# Patient Record
Sex: Male | Born: 1985 | Race: White | Hispanic: No
Health system: Midwestern US, Community
[De-identification: ages and names within clinical notes are randomized; demographics above are authoritative.]

## PROBLEM LIST (undated history)

## (undated) DIAGNOSIS — I82409 Acute embolism and thrombosis of unspecified deep veins of unspecified lower extremity: Secondary | ICD-10-CM

## (undated) HISTORY — PX: WISDOM TOOTH EXTRACTION: SHX21

---

## 2013-05-14 NOTE — ED Provider Notes (Signed)
[facility]  EMERGENCY DEPARTMENT TREATMENT REPORT  NAME:  Carlos Bray  SEX:   M  ADMIT: 05/14/2013  DOB:   1986-03-07  MR#    [id number]  ROOM:    TIME DICTATED: 04 56 PM  ACCT#  [id number]        TIME OF EVALUATION:   1630    PRIMARY CARE DOCTOR:   None.    CHIEF COMPLAINT:   Chest pressure.    HISTORY OF PRESENT ILLNESS:   This is a 28 year old male who presents for evaluation of pressure in his   chest he began experiencing at approximately 1:30 this afternoon when he was   headed [place] the mall.  He told his wife he did not feel well and she should   drive him to the Emergency Department, so that is what she did.  He tells me   he had another episode of this type of feeling back in December when he was   diagnosed with the flu. This was back in December.  He has not had a cardiac   workup of any sort, has not had a stress test.  He is currently stating that   he is not experiencing the chest pressure. He did not experience any shortness   of breath with this or any diaphoresis. He did not take any medication prior   to his arrival in the Emergency Department.  His wife drove him over.  He   otherwise denies any fever, cough, is not experiencing any chest pain.  No   shortness of breath.  No abdominal pain. No nausea, no vomiting, no diarrhea.        REVIEW OF SYSTEMS:   CONSTITUTIONAL:  No fever, chills, weight loss.    EYES: No visual symptoms.   ENT: No sore throat, runny nose or other URI symptoms.   ENDOCRINE:  No diabetic symptoms.    HEMATOLOGIC AND LYMPHATIC:  No excessive bruising or lymph node swelling.    ALLERGIC AND IMMUNOLOGIC:  No urticaria or allergy symptoms.    RESPIRATORY:  No cough, shortness of breath, or wheezing.    CARDIOVASCULAR: Positive for chest pressure.  GASTROINTESTINAL:  No vomiting, diarrhea, or abdominal pain.    GENITOURINARY:  No dysuria, frequency, or urgency.   MUSCULOSKELETAL:  No joint pain or swelling.   INTEGUMENTARY:  No rashes.    NEUROLOGICAL:  No headaches, sensory or motor symptoms.     PAST MEDICAL HISTORY:   The patient has no pertinent past medical or surgical history.    FAMILY HISTORY:   No cardiac history in the family.    PSYCHIATRIC HISTORY:  No psychiatric history.     SOCIAL HISTORY:   The patient does not smoke, drinks socially, does not use illicit drugs.     ALLERGIES:   SULFA DRUGS.    MEDICATIONS:    None.       PHYSICAL EXAMINATION:   VITAL SIGNS: Blood pressure 146/78, pulse 109, respirations 16, temperature   97.7, he is satting at 100% on room air.  CONSTITUTIONAL: This is a well developed, well nourished appearing 28 year old   male gentleman lying comfortably on the bed, does not appear to be in any   acute distress.  He is cooperative with my evaluation.    HEENT: Eyes:  Conjunctivae clear, lids normal.  Pupils equal, symmetrical, and   normally reactive. Mouth/Throat:  Surfaces of the pharynx, palate, and tongue   are pink, moist, and without lesions.  RESPIRATORY:  Clear and equal breath sounds.  No respiratory distress,   tachypnea, or accessory muscle use.   CARDIOVASCULAR: Regular rate and rhythm. There are no murmurs, no rubs, no   gallops. As I stand in the room watching the monitor, he goes from   approximately 116 up to 130 beats per minute. I can visibly see his pulses in   his carotids and on the top of his chest.  DP pulses 2+ and equal bilaterally.   No peripheral edema or significant varicosities.   CHEST:  Chest symmetrical without masses or tenderness.     GASTROINTESTINAL:  Abdomen soft, nontender, without complaint of pain to   palpation.  No hepatomegaly or splenomegaly.   MUSCULOSKELETAL:   Nails:  No clubbing or deformities.  Nail beds pink with   prompt capillary refill.   SKIN:  Warm and dry without rashes.   NEUROLOGIC: The patient is alert and oriented times 3. He is able to move all   his extremities, upper and lower bilaterally, without difficulty.       CONTINUATION BY JEFFREY PADGETT, PA-C:     IMPRESSION AND PLAN:   This is a 28 year old male who presents for evaluation of chest pressure,   which has now resolved in the Emergency Department.  He has suffered from this   one other previous time. He is also having some tachycardia. He states   everytime he gets around a hospital, nurses, doctors - he gets tachycardic.    He also does admit to Dr. Jerolyn Center that he has had a history of acid reflux in   the past and he is suppose to be taking ranitidine for that, but he has not   been.  However, given his current presentation, we will go ahead and obtain a   cardiac set of enzymes on him and a chest x-ray and EKG.      DIAGNOSTIC INTERPRETATIONS:   CPK normal. Troponin normal.  BMP unremarkable with exception of chloride of   108, glucose 133. CBC unremarkable with exception of RBCs 5.81, hematocrit 51.   Chest x-ray is read by Dr. Verdis Frederickson as no acute disease.   EKG:  Dr. Stephan Minister did not see any acute S-T segment or T-wave abnormalities that are   consistent with acute ischemia or infarction.     COURSE IN THE EMERGENCY DEPARTMENT:     I discussed the results of the negative workup with the patient and the fact   that he is suppose to be on some medication for his acid reflux and this could   possibly be contributing to the problem. The patient stated he would start   that medication again and would follow up with primary care doctor tomorrow.   He felt he was ready for discharge at this point and we agreed.      DIAGNOSES:  1.  Chest pressure.  2.  Reflux esophagitis.     DISPOSITION:   The patient remained completely stable during his course in the Emergency   Department.  He was discharged home in stable condition.  I wrote him a   prescriptions for ranitidine. Encouraged fluids. Follow up with regular care   doctor, I gave him the name of primary care doctor as well, tomorrow. The   patient was agreeable with that plan.  The patient was seen and evaluated  by   myself and Dr. Jerolyn Center, who agrees with my assessment and plan.  ___________________  Konrad FelixLewis H Khoa Opdahl MD  Dictated By: Fransisca KaufmannJeffrey Padgett, PA-C    My signature above authenticates this document and my orders, the final  diagnosis (es), discharge prescription (s), and instructions in the [facility]   Pulsecheck record.  Nursing notes have been reviewed by the physician/mid-level provider.    If you have any questions please contact [phone].    JM  D:05/14/2013 16:56:02  T: 05/14/2013 09:81:1920:08:57  [id number]  Authenticated by Janett BillowLewis H. Henrene HawkingSiegel, M.D. On 05/19/2013 11:34:50 PM

## 2020-01-12 ENCOUNTER — Emergency Department (HOSPITAL_COMMUNITY): Payer: Worker's Compensation

## 2020-01-12 ENCOUNTER — Encounter (HOSPITAL_COMMUNITY): Payer: Self-pay

## 2020-01-12 ENCOUNTER — Emergency Department (HOSPITAL_COMMUNITY)
Admission: EM | Admit: 2020-01-12 | Discharge: 2020-01-12 | Disposition: A | Discharge status: Home / Self Care | Payer: Worker's Compensation | Attending: Emergency Medicine | Admitting: Emergency Medicine

## 2020-01-12 ENCOUNTER — Other Ambulatory Visit: Payer: Self-pay

## 2020-01-12 ENCOUNTER — Emergency Department (HOSPITAL_COMMUNITY): Payer: Self-pay

## 2020-01-12 DIAGNOSIS — I82411 Acute embolism and thrombosis of right femoral vein: Secondary | ICD-10-CM | POA: Insufficient documentation

## 2020-01-12 DIAGNOSIS — Z87891 Personal history of nicotine dependence: Secondary | ICD-10-CM | POA: Insufficient documentation

## 2020-01-12 DIAGNOSIS — W540XXD Bitten by dog, subsequent encounter: Secondary | ICD-10-CM | POA: Diagnosis not present

## 2020-01-12 DIAGNOSIS — Z882 Allergy status to sulfonamides status: Secondary | ICD-10-CM | POA: Diagnosis not present

## 2020-01-12 DIAGNOSIS — Z4800 Encounter for change or removal of nonsurgical wound dressing: Secondary | ICD-10-CM | POA: Diagnosis present

## 2020-01-12 LAB — CBC WITH DIFFERENTIAL/PLATELET
Abs Immature Granulocytes: 0.07 10*3/uL (ref 0.00–0.07)
Basophils Absolute: 0 10*3/uL (ref 0.0–0.1)
Basophils Relative: 0 %
Eosinophils Absolute: 0.2 10*3/uL (ref 0.0–0.5)
Eosinophils Relative: 2 %
HCT: 45.6 % (ref 39.0–52.0)
Hemoglobin: 14.9 g/dL (ref 13.0–17.0)
Immature Granulocytes: 1 %
Lymphocytes Relative: 8 %
Lymphs Abs: 0.8 10*3/uL (ref 0.7–4.0)
MCH: 28.9 pg (ref 26.0–34.0)
MCHC: 32.7 g/dL (ref 30.0–36.0)
MCV: 88.4 fL (ref 80.0–100.0)
Monocytes Absolute: 0.8 10*3/uL (ref 0.1–1.0)
Monocytes Relative: 8 %
Neutro Abs: 8.5 10*3/uL — ABNORMAL HIGH (ref 1.7–7.7)
Neutrophils Relative %: 81 %
Platelets: 389 10*3/uL (ref 150–400)
RBC: 5.16 MIL/uL (ref 4.22–5.81)
RDW: 11.8 % (ref 11.5–15.5)
WBC: 10.4 10*3/uL (ref 4.0–10.5)
nRBC: 0 % (ref 0.0–0.2)

## 2020-01-12 LAB — BASIC METABOLIC PANEL
Anion gap: 10 (ref 5–15)
BUN: 14 mg/dL (ref 6–20)
CO2: 25 mmol/L (ref 22–32)
Calcium: 8.9 mg/dL (ref 8.9–10.3)
Chloride: 101 mmol/L (ref 98–111)
Creatinine, Ser: 1 mg/dL (ref 0.61–1.24)
GFR, Estimated: >60 mL/min (ref >60)
Glucose, Bld: 134 mg/dL — ABNORMAL HIGH (ref 70–99)
Potassium: 3.5 mmol/L (ref 3.5–5.1)
Sodium: 136 mmol/L (ref 135–145)

## 2020-01-12 MED ORDER — APIXABAN (ELIQUIS) EDUCATION KIT FOR DVT/PE PATIENTS: PACK | Freq: Once | Status: AC

## 2020-01-12 MED ORDER — APIXABAN 5 MG PO TABS
10.0000 mg | ORAL_TABLET | Freq: Once | ORAL | Status: AC
  Administered 2020-01-12: 10 mg via ORAL
  Filled 2020-01-12: qty 2

## 2020-01-12 MED ORDER — IOHEXOL 300 MG/ML  SOLN
75.0000 mL | Freq: Once | INTRAMUSCULAR | Status: AC | PRN
  Administered 2020-01-12: 75 mL via INTRAVENOUS

## 2020-01-12 MED ORDER — APIXABAN (ELIQUIS) VTE STARTER PACK (10MG AND 5MG): ORAL_TABLET | ORAL | 0 refills | Status: DC

## 2020-01-12 NOTE — ED Notes (Signed)
Pt transported to CT ?

## 2020-01-12 NOTE — ED Triage Notes (Signed)
Pt presents to ED following dog bite while at work on 12/30/19. Animal control has already been notified. Dog was UTD on rabies vaccines. Pt has been seeing [facility] and wants second opinion to wound site on right leg. Pt states he has had an increased in swelling around the area since yesterday. Pt states temp has been about 100. Pt has been taking Augmentin. Pt UTD on tetanus.

## 2020-01-12 NOTE — ED Notes (Signed)
Ultrasound at bedside

## 2020-01-12 NOTE — ED Provider Notes (Signed)
[facility] EMERGENCY DEPARTMENT Provider Note   CSN: [id number] Arrival date & time: 01/12/20  0175     History Chief Complaint  Patient presents with  . Animal Bite    Briscoe Daniello is a 34 y.o. male.  Bertel Venard is a 34 y.o. male who is otherwise healthy, presents for wound check.  Patient states that while at work on 10/16 he was bit by a dog on the back of his right leg just below the knee and had 2 wounds he went the next morning to urgent care and they cleaned them out and initially sutured them close but a few days later he came back with worsening pain and swelling and some drainage, on the more lateral of the 2 wounds he developed an abscess, they removed the sutures and began packing the wound.  He has been following up daily for wound checks and has been on Augmentin for antibiotic coverage.  States he was last seen yesterday for packing change.  He states that he decided to come to the emergency department today because he was concerned that he was having increasing swelling in the lower leg and worsening pain.  He states that they took the sutures out of all the wounds but had only been packing one of them, he has a more medial wound that he is concerned could be developing an abscess now.  He denies fevers.  Has been taking his antibiotics regularly.  No history of blood clots.  Denies any associated chest pain or shortness of breath.  Has been taking ibuprofen for pain with some improvement.        History reviewed. No pertinent past medical history.  There are no problems to display for this patient.   Past Surgical History:  Procedure Laterality Date  . WISDOM TOOTH EXTRACTION         No family history on file.  Social History   Tobacco Use  . Smoking status: Former Research scientist (life sciences)  . Smokeless tobacco: Never Used  Substance Use Topics  . Alcohol use: Not Currently  . Drug use: Never    Home Medications Prior to Admission medications   Medication Sig Start Date  End Date Taking? Authorizing Provider  APIXABAN (ELIQUIS) VTE STARTER PACK ($RemoveBefor'10MG'mSlatgJZeHLc$  AND $Re'5MG'dIB$ ) Take as directed on package: start with two-$RemoveBefore'5mg'xdSFdowVjsoBk$  tablets twice daily for 7 days. On day 8, switch to one-$RemoveBefor'5mg'RjgZOXuuCCUG$  tablet twice daily. 01/12/20   Jacqlyn Larsen, PA-C    Allergies    Sulfa antibiotics  Review of Systems   Review of Systems  Constitutional: Negative for chills and fever.  HENT: Negative.   Respiratory: Negative for cough and shortness of breath.   Cardiovascular: Positive for leg swelling. Negative for chest pain and palpitations.  Gastrointestinal: Negative for abdominal pain, nausea and vomiting.  Musculoskeletal: Positive for myalgias.  Skin: Positive for color change and wound.  Neurological: Negative for weakness and numbness.  All other systems reviewed and are negative.   Physical Exam Updated Vital Signs BP (!) 160/93 (BP Location: Right Arm)   Pulse (!) 114   Temp 99.2 F (37.3 C) (Oral)   Ht $R'5\' 7"'lO$  (1.702 m)   Wt 113.4 kg   SpO2 98%   BMI 39.16 kg/m   Physical Exam Vitals and nursing note reviewed.  Constitutional:      General: He is not in acute distress.    Appearance: Normal appearance. He is well-developed. He is not diaphoretic.     Comments: Well-appearing and in  no distress  HENT:     Head: Normocephalic and atraumatic.  Eyes:     General:        Right eye: No discharge.        Left eye: No discharge.  Cardiovascular:     Rate and Rhythm: Normal rate and regular rhythm.     Heart sounds: Normal heart sounds.  Pulmonary:     Effort: Pulmonary effort is normal. No respiratory distress.     Breath sounds: Normal breath sounds.  Abdominal:     Palpations: Abdomen is soft.     Tenderness: There is no abdominal tenderness.  Musculoskeletal:     Comments: 2 wounds noted on the posterior right lower leg just below the knee.  The more lateral wound has packing material in place with no significant surrounding drainage or erythema, there is a small more  medial wound that is open, with palpation there is no expressible drainage, no packing material present.  Calf is firm and tender to palpation with swelling when compared to the left leg.  Edema is nonpitting.  Distal pulses 2+, normal sensation, strength and range of motion.  There is some mild redness around the more medial wound, no lymphangitic streaking.  Skin:    General: Skin is warm and dry.  Neurological:     Mental Status: He is alert and oriented to person, place, and time.     Coordination: Coordination normal.  Psychiatric:        Mood and Affect: Mood normal.        Behavior: Behavior normal.     ED Results / Procedures / Treatments   Labs (all labs ordered are listed, but only abnormal results are displayed) Labs Reviewed  BASIC METABOLIC PANEL - Abnormal; Notable for the following components:      Result Value   Glucose, Bld 134 (*)    All other components within normal limits  CBC WITH DIFFERENTIAL/PLATELET - Abnormal; Notable for the following components:   Neutro Abs 8.5 (*)    All other components within normal limits    EKG None  Radiology CT TIBIA FIBULA RIGHT W CONTRAST  Addendum Date: 01/12/2020   ADDENDUM REPORT: 01/12/2020 12:53 ADDENDUM: This case was subsequently reviewed and discussed with Benedetto Goad regarding the possibility of a DVT. No definite DVT is seen, although vascular opacification is limited on this examination. Venous Doppler ultrasound recommended if that remains a clinical concern. Electronically Signed   By: Richardean Sale M.D.   On: 01/12/2020 12:53   Result Date: 01/12/2020 CLINICAL DATA:  Dog bite to the lower leg 13 days ago. Increasing pain, swelling and drainage from the wound. EXAM: CT OF THE LOWER RIGHT EXTREMITY WITH CONTRAST TECHNIQUE: Multidetector CT imaging of the right lower leg was performed according to the standard protocol following intravenous contrast administration. COMPARISON:  None. CONTRAST:  47m OMNIPAQUE  IOHEXOL 300 MG/ML  SOLN FINDINGS: Bones/Joint/Cartilage The tibia and fibula appear normal. There is no evidence of acute fracture, dislocation or bone destruction. No significant knee or ankle joint effusion. The joint spaces appear preserved. Ligaments Suboptimally assessed by CT. Muscles and Tendons Superficial wound medially in the calf without underlying muscular abnormality or abnormal enhancement. Soft tissues There is a linear soft tissue defect within the subcutaneous fat the medial calf, extending up to 3.6 cm in length on image 64/5. There is high density within this wound, presumably related to packing or ointment. There is a small amount of gas in the  wound and soft tissue stranding in the surrounding fat consistent with cellulitis. No drainable fluid collection or definite foreign body. IMPRESSION: 1. Superficial wound medially in the calf with internal high density, presumably related to packing or ointment. Probable associated mild cellulitis. No drainable fluid collection or definite foreign body. 2. No evidence of deep soft tissue infection, osteomyelitis or septic joint. Electronically Signed: By: Richardean Sale M.D. On: 01/12/2020 12:16   US Venous Img Lower Unilateral Right  Result Date: 01/12/2020 CLINICAL DATA:  34 year old male with right calf swelling status post dog bite. EXAM: RIGHT LOWER EXTREMITY VENOUS DOPPLER ULTRASOUND TECHNIQUE: Gray-scale sonography with graded compression, as well as color Doppler and duplex ultrasound were performed to evaluate the right lower extremity deep venous systems from the level of the common femoral vein and including the common femoral, femoral, profunda femoral, popliteal and calf veins including the posterior tibial, peroneal and gastrocnemius veins when visible. Spectral Doppler was utilized to evaluate flow at rest and with distal augmentation maneuvers in the common femoral, femoral and popliteal veins. The contralateral common femoral vein  was also evaluated for comparison. COMPARISON:  None. FINDINGS: RIGHT LOWER EXTREMITY Common Femoral Vein: No evidence of thrombus. Normal compressibility, respiratory phasicity and response to augmentation. Central Greater Saphenous Vein: No evidence of thrombus. Normal compressibility and flow on color Doppler imaging. Central Profunda Femoral Vein: No evidence of thrombus. Normal compressibility and flow on color Doppler imaging. Femoral Vein: Occluded throughout with mildly expansile hypoechoic thrombus. Popliteal Vein: Occluded throughout with mildly expansile hypoechoic thrombus. Calf Veins: Occluded throughout. Other Findings:  None. LEFT LOWER EXTREMITY Common Femoral Vein: No evidence of thrombus. Normal compressibility, respiratory phasicity and response to augmentation. IMPRESSION: Acute right lower extremity deep vein thrombosis extending from the calf veins to the saphenofemoral junction. These results will be called to the ordering clinician or representative by the Radiologist Assistant, and communication documented in the PACS or [employer]. Ruthann Cancer, MD [facility] Electronically Signed   By: Ruthann Cancer MD   On: 01/12/2020 13:55    Procedures Procedures (including critical care time)  Medications Ordered in ED Medications  iohexol (OMNIPAQUE) 300 MG/ML solution 75 mL (75 mLs Intravenous Contrast Given 01/12/20 1149)  apixaban (ELIQUIS) Education Kit for DVT/PE patients ( Does not apply Given 01/12/20 1559)  apixaban (ELIQUIS) tablet 10 mg (10 mg Oral Given 01/12/20 1537)    ED Course  I have reviewed the triage vital signs and the nursing notes.  Pertinent labs & imaging results that were available during my care of the patient were reviewed by me and considered in my medical decision making (see chart for details).    MDM Rules/Calculators/A&P                          34 year old male presents with concern  for worsening infection following a dog bite while at work on 10/16.  The day after he was evaluated at [facility] urgent care, and had stitches placed, a few days later he returned due to drainage from one of the wounds, they remove stitches, drained developed abscess and have been packing the wound and seeing him back frequently for wound checks.  He got concerned that he was seeing worsening swelling in his calf.  Concerned that patient could be developing a larger fluid collection will get basic labs and CT of the lower leg with contrast.  Lab work is reassuring without leukocytosis or  significant electrolyte derangements  CT with superficial wound medially in the calf with packing material noted mild associated cellulitis but no drainable fluid collection no evidence of deep soft tissue infection.  No evidence of osteomyelitis or septic joint.  Given reassuring CT that does not explain worsening pain and swelling will get DVT study.  Called by pharmacy, patient is positive for DVT which certainly explains his worsening pain and swelling.  We will have him continue with Augmentin and wound checks as directed by urgent care and will start him on Eliquis.  Pharmacy consulted for education.  Pharmacy provided education on Eliquis and provided patient with starter pack and discount card.  He has been given his first dose of Eliquis here in the ED.  He expresses understanding and agreement with plan.  I have stressed the importance of close follow-up with a primary care provider to help monitor his progress and help determine how long he will need to be on blood thinner.  We will have him continue course of antibiotics.  And follow-up with the urgent care for continued wound care as planned.  Discharged home in good condition.  Final Clinical Impression(s) / ED Diagnoses Final diagnoses:  Acute deep vein thrombosis (DVT) of femoral vein of right lower extremity ([place])  Dog bite, subsequent encounter    Rx /  DC Orders ED Discharge Orders         Ordered    APIXABAN (ELIQUIS) VTE STARTER PACK (10MG AND 5MG)        01/12/20 1604           Jacqlyn Larsen, PA-C 01/16/20 0243    Sherwood Gambler, MD 01/17/20 [phone]

## 2020-01-12 NOTE — Discharge Instructions (Addendum)
You have a blood clot, this is why your leg is become more swollen, CT did not show signs of worsening infection, continue with antibiotics and wound care as directed by urgent care.  You will need to begin taking a blood thinner twice daily as directed.  Follow instructions on starter pack for appropriate dosages.  It is extremely important that you follow-up closely with a primary care provider to help continue prescribing this medicine and to help determine how long you will need to be on it.  If you develop worsening leg pain or swelling, or any chest pain or shortness of breath return for reevaluation.  [facility] Primary Care Doctor List  Syliva Overman, MD. Specialty: [facility] Medicine Contact information: [address]  [place] [address]  [phone]   Lilyan Punt, MD. Specialty: [facility] Medicine Contact information: [address]  [place] [address]  [phone]   Avon Gully, MD Specialty: Internal Medicine Contact information: [address] [place] [address]  [phone]   Catalina Pizza, MD. Specialty: Internal Medicine Contact information: [address]  [place] [address]  [phone]    [facility] (Dr. Selena Batten) Specialty: Family Medicine Contact information: [address]  [place] [address]  [phone]   John Giovanni, MD. Specialty: [facility] Medicine Contact information: [address]  [address]  [place] [address]  [phone]   Carylon Perches, MD. Specialty: Internal Medicine Contact information: [address]  [address]  [place] [address]  [phone]    [facility]  [address] [place], [place] [address] [phone]  Services [facility] offers a variety of basic health services.  Services include but are not limited to: Blood pressure checks  Heart rate checks  Blood sugar checks  Urine  analysis  Rapid strep tests  Pregnancy tests.  Health education and referrals  People needing more complex services will be directed to a physician online. Using these virtual visits, doctors can evaluate and prescribe medicine and treatments. There will be no medication on-site, though [facility] will help patients fill their prescriptions at little to no cost.   For More information please go to: [email or web address]

## 2020-02-21 ENCOUNTER — Emergency Department (HOSPITAL_COMMUNITY): Payer: Worker's Compensation

## 2020-02-21 ENCOUNTER — Encounter (HOSPITAL_COMMUNITY): Payer: Self-pay | Admitting: Emergency Medicine

## 2020-02-21 ENCOUNTER — Other Ambulatory Visit: Payer: Self-pay

## 2020-02-21 ENCOUNTER — Emergency Department (HOSPITAL_COMMUNITY)
Admission: EM | Admit: 2020-02-21 | Discharge: 2020-02-21 | Disposition: A | Discharge status: Home / Self Care | Payer: Worker's Compensation | Attending: Emergency Medicine | Admitting: Emergency Medicine

## 2020-02-21 DIAGNOSIS — M79604 Pain in right leg: Secondary | ICD-10-CM | POA: Diagnosis not present

## 2020-02-21 DIAGNOSIS — Z7901 Long term (current) use of anticoagulants: Secondary | ICD-10-CM | POA: Insufficient documentation

## 2020-02-21 DIAGNOSIS — Z86718 Personal history of other venous thrombosis and embolism: Secondary | ICD-10-CM | POA: Diagnosis not present

## 2020-02-21 DIAGNOSIS — Z87891 Personal history of nicotine dependence: Secondary | ICD-10-CM | POA: Insufficient documentation

## 2020-02-21 HISTORY — DX: Acute embolism and thrombosis of unspecified deep veins of unspecified lower extremity: I82.409

## 2020-02-21 NOTE — Discharge Instructions (Addendum)
The ultrasound of your leg today does not show any new blood clots of your right leg.  Continue to take your Eliquis daily as directed.  You may take Tylenol every 4 hours if needed for pain.  Follow-up with your primary doctor for recheck

## 2020-02-21 NOTE — ED Triage Notes (Addendum)
Pt c/o rt leg swelling with walking. Pt had a blood clot on 10/29 and was placed on eliquis. Pulses intact. Both feet cold to the touch.   Denies pain.

## 2020-02-21 NOTE — ED Provider Notes (Signed)
[facility] EMERGENCY DEPARTMENT Provider Note   CSN: [id number] Arrival date & time: 02/21/20  1534     History Chief Complaint  Patient presents with  . Leg Pain    James Rhodes is a 34 y.o. male.  HPI     James Rhodes is a 34 y.o. male who presents to the Emergency Department complaining of recurrent pain, redness and swelling of his right lower leg.  He suffered a dog bite to the back of his right lower leg in October.  He was initially treated for the dog bite at a urgent care center and subsequently developed an abscess to the area.  He was seen here the latter part of October found to have a DVT of the same leg.  He was started on Eliquis at that time.  He has been followed by the wound care center for wound packing 3 times per week states the wound has been healing well.  He noticed gradually increasing pain associated with standing and walking and associated swelling of the right lower leg several days ago.  He denies any missed doses of his Eliquis, but is concerned the he has a worsening clot of his leg.  He denies any chest pain, shortness of breath, fever or chills, numbness or weakness of his lower extremities.    Past Medical History:  Diagnosis Date  . DVT (deep venous thrombosis) (HCC)     There are no problems to display for this patient.   Past Surgical History:  Procedure Laterality Date  . WISDOM TOOTH EXTRACTION       History reviewed. No pertinent family history.  Social History   Tobacco Use  . Smoking status: Former Games developer  . Smokeless tobacco: Never Used  Vaping Use  . Vaping Use: Never used  Substance Use Topics  . Alcohol use: Not Currently  . Drug use: Never    Home Medications Prior to Admission medications   Medication Sig Start Date End Date Taking? Authorizing Provider  APIXABAN (ELIQUIS) VTE STARTER PACK (10MG  AND 5MG ) Take as directed on package: start with two-5mg  tablets twice daily for 7 days. On day 8, switch to one-5mg  tablet  twice daily. 01/12/20   , PA-C    Allergies    Sulfa antibiotics  Review of Systems   Review of Systems  Constitutional: Negative for chills and fever.  Respiratory: Negative for chest tightness and shortness of breath.   Cardiovascular: Negative for chest pain.  Gastrointestinal: Negative for abdominal pain, nausea and vomiting.  Genitourinary: Negative for difficulty urinating.  Musculoskeletal: Positive for myalgias (right lower leg pain, swelling and redness). Negative for arthralgias, back pain and joint swelling.  Skin: Negative for color change and wound.  Neurological: Negative for dizziness, weakness and numbness.  Psychiatric/Behavioral: Negative for confusion.    Physical Exam Updated Vital Signs BP (!) 143/91 (BP Location: Left Arm)   Pulse 90   Temp 98.4 F (36.9 C) (Oral)   Resp 18   Ht 5\' 7"  (1.702 m)   Wt 111.1 kg   SpO2 100%   BMI 38.37 kg/m   Physical Exam Vitals and nursing note reviewed.  Constitutional:      Appearance: Normal appearance. He is not ill-appearing.  Cardiovascular:     Rate and Rhythm: Normal rate and regular rhythm.     Pulses: Normal pulses.  Pulmonary:     Effort: Pulmonary effort is normal.     Breath sounds: Normal breath sounds.  Abdominal:  Palpations: Abdomen is soft.     Tenderness: There is no abdominal tenderness.  Musculoskeletal:        General: Tenderness present. No signs of injury.     Comments: Mild erythema and edema of the right lower extremity.  Tenderness along the posterior right lower leg.  No palpable cord or lymphadenopathy.  Patient has a 1-2 cm open wound to the medial aspect of the right lower leg with packing in place.  No surrounding erythema or purulent drainage.  Wound appears chronic.  Skin:    General: Skin is warm.     Capillary Refill: Capillary refill takes less than 2 seconds.     Findings: No rash.  Neurological:     General: No focal deficit present.     Mental Status: He  is alert.     Sensory: No sensory deficit.     Motor: No weakness.     ED Results / Procedures / Treatments   Labs (all labs ordered are listed, but only abnormal results are displayed) Labs Reviewed - No data to display  EKG None  Radiology US Venous Img Lower Unilateral Right  Result Date: 02/21/2020 CLINICAL DATA:  34 year old with history of right lower extremity DVT. Right leg pain. EXAM: RIGHT LOWER EXTREMITY VENOUS DOPPLER ULTRASOUND TECHNIQUE: Gray-scale sonography with graded compression, as well as color Doppler and duplex ultrasound were performed to evaluate the lower extremity deep venous systems from the level of the common femoral vein and including the common femoral, femoral, profunda femoral, popliteal and calf veins including the posterior tibial, peroneal and gastrocnemius veins when visible. The superficial great saphenous vein was also interrogated. Spectral Doppler was utilized to evaluate flow at rest and with distal augmentation maneuvers in the common femoral, femoral and popliteal veins. COMPARISON:  01/12/2020 FINDINGS: Contralateral Common Femoral Vein: Negative for thrombus. Normal compressibility, color Doppler flow and augmentation. Common Femoral Vein: Negative for thrombus. Normal compressibility, color Doppler flow and augmentation. Saphenofemoral Junction: No evidence of thrombus. Normal compressibility and flow on color Doppler imaging. Thrombus near the saphenofemoral junction has resolved since the previous examination. Profunda Femoral Vein: No evidence of thrombus. Normal compressibility and flow on color Doppler imaging. Femoral Vein: Positive for thrombus. Majority of thrombus in the proximal femoral vein has resolved. Persistent thrombus in the mid and distal femoral vein. Partial recanalization in the mid and distal femoral vein since the previous examination. Popliteal Vein: Positive for thrombus. Persistent thrombus in the popliteal vein. Thrombus is  not completely occlusive. Calf Veins: Positive for thrombus. Nonocclusive thrombus involving right posterior tibial veins. Previously posterior tibial vein thrombus was occlusive. Limited evaluation of the peroneal veins but there is flow in the peroneal veins. Superficial Great Saphenous Vein: No evidence of thrombus. Normal compressibility. Other Findings:  None. IMPRESSION: Right lower extremity deep venous thrombosis clot burden has decreased. Thrombus at the right saphenofemoral junction and proximal right femoral vein has nearly resolved. Nonocclusive thrombus remaining in the right mid and distal femoral vein, right popliteal vein and right deep calf veins. Electronically Signed   By: Richarda Overlie M.D.   On: 02/21/2020 17:24    Procedures Procedures (including critical care time)  Medications Ordered in ED Medications - No data to display  ED Course  I have reviewed the triage vital signs and the nursing notes.  Pertinent labs & imaging results that were available during my care of the patient were reviewed by me and considered in my medical decision making (see chart for  details).    MDM Rules/Calculators/A&P                          Patient here with concerns of recurrent or worsening DVT of the right lower leg.  History of dog bite to the right lower leg in October.  He is anticoagulated with Eliquis twice daily no missed doses.  He has a healing open wound to the medial right lower leg, he is followed by wound care clinic and has dressing changes 3 times per week.  On exam, there is mild edema and erythema noted to the right lower leg. No lymphangitis.  DP and posterior pulses are intact.  Given patient is anticoagulated, I doubt new blood clot but he is stating the swelling and redness are new, so DVT study ordered.  Ultrasound of right lower extremity negative for worsening or new clots.  No concerning symptoms on exam to suggest cellulitis or abscess.  Patient reassured.  He agrees to  continue current treatment, Eliquis, and close outpatient follow-up.  The patient appears reasonably screened and/or stabilized for discharge and I doubt any other medical condition or other [facility] requiring further screening, evaluation, or treatment in the ED at this time prior to discharge.  Final Clinical Impression(s) / ED Diagnoses Final diagnoses:  Right leg pain    Rx / DC Orders ED Discharge Orders    None       Rosey Bath 02/21/20 1904    Dione Booze, MD 02/22/20 0010

## 2021-03-10 IMAGING — CT CT TIBIA FIBULA *R* W/ CM
3 of 4 series · 9 of 33 positions shown, 10 images · IV contrast (agent unspecified)
Comparison: None.

CONTRAST:  75mL OMNIPAQUE IOHEXOL 300 MG/ML  SOLN
COMPARISON: None.

CONTRAST:  75mL OMNIPAQUE IOHEXOL 300 MG/ML  SOLN

Addendum:
CLINICAL DATA: Dog bite to the lower leg 13 days ago. Increasing
pain, swelling and drainage from the wound.

EXAM:
CT OF THE LOWER RIGHT EXTREMITY WITH CONTRAST
TECHNIQUE: Multidetector CT imaging of the right lower leg was performed
according to the standard protocol following intravenous contrast
administration.

[Series 5: axial st · axial · 0.57mm/px · z∈[-448,-154]mm · 3 of 239 slices shown, 4 images]
[im 55/239  soft-tissue]
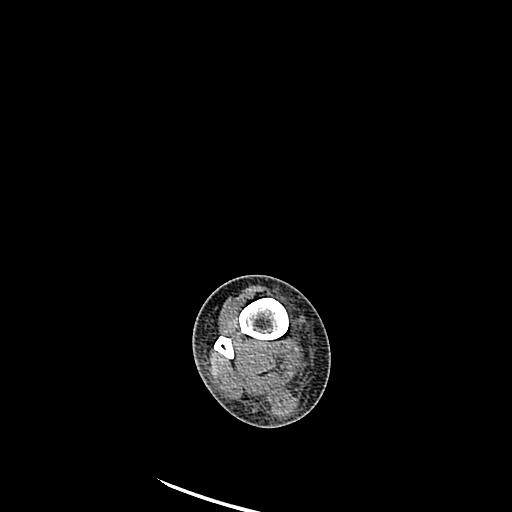
[im 55/239  bone]
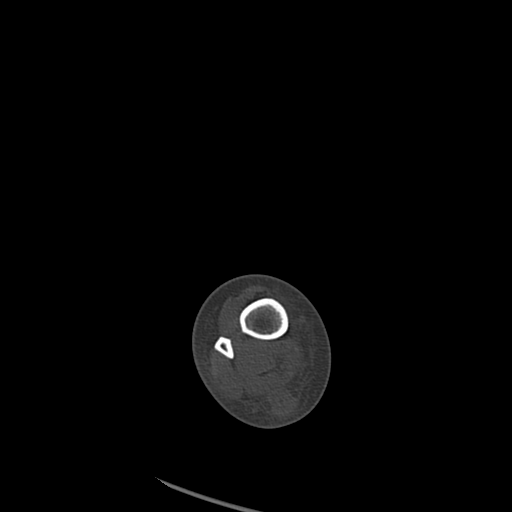
[im 129/239  bone]
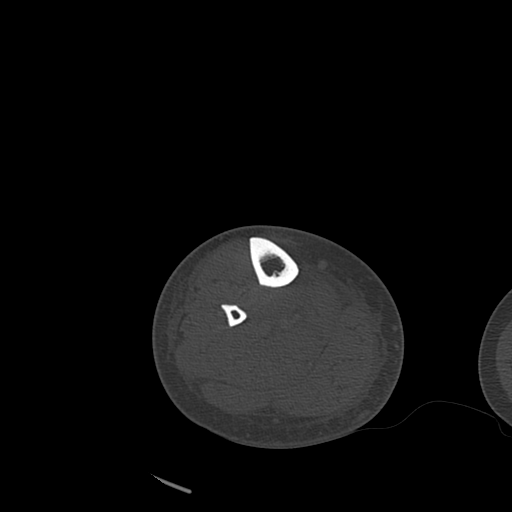
[im 202/239  bone]
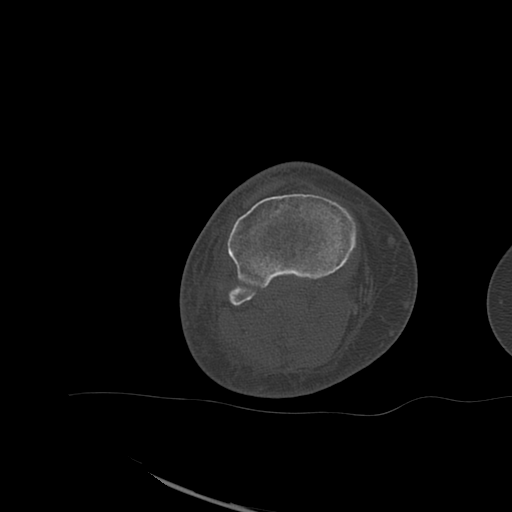

[Series 6: coro bone · coronal · 0.39mm/px · 1 of 95 slices shown]
[im 48/95  bone]
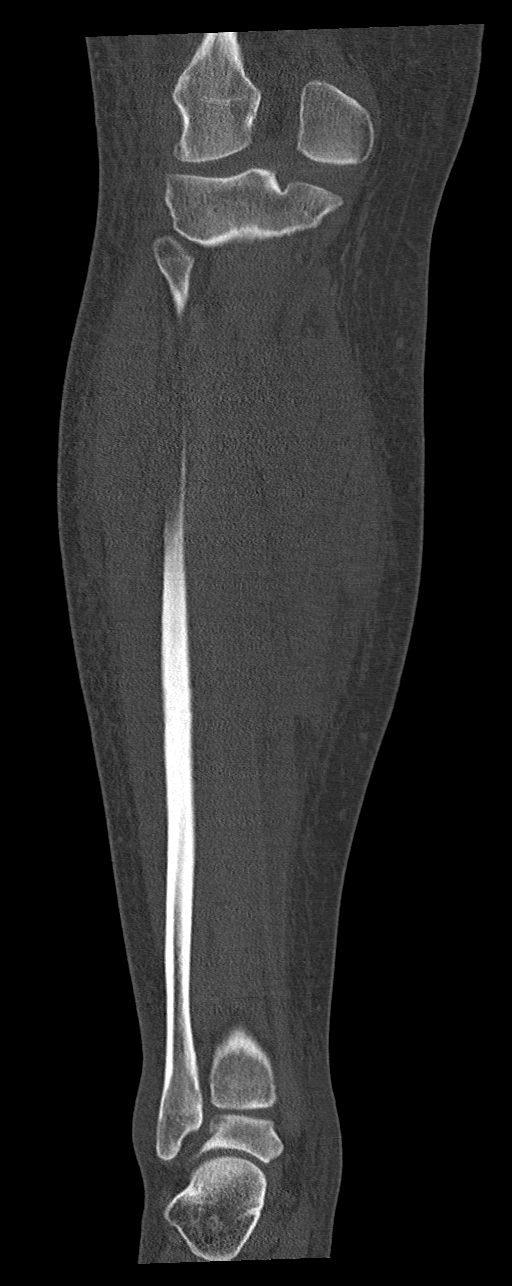

[Series 9: sag st · sagittal · 0.42mm/px · 5 of 99 slices shown]
[im 33/99  bone]
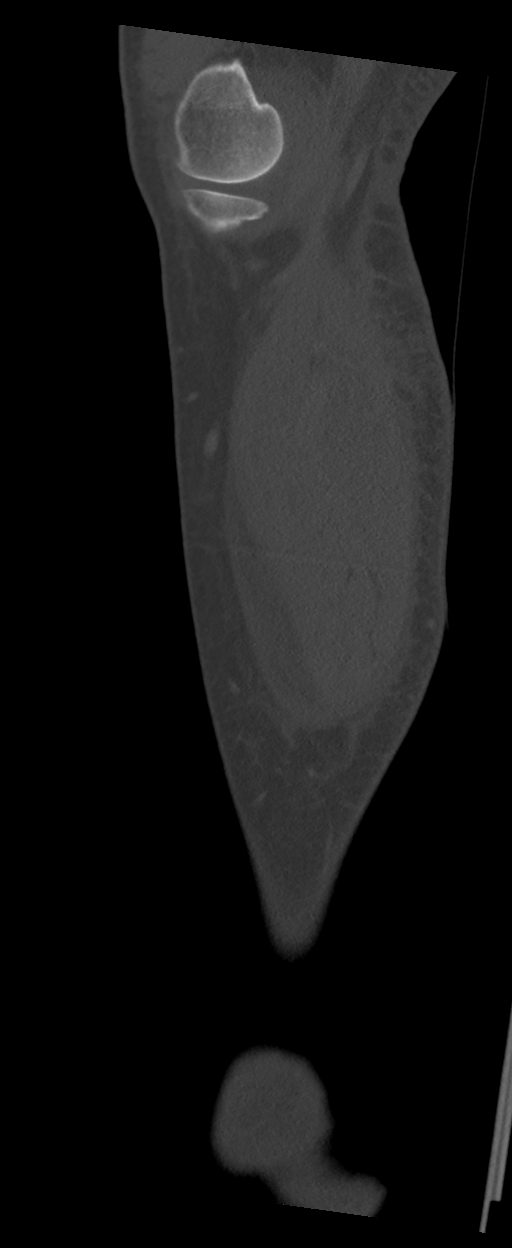
[im 41/99  bone]
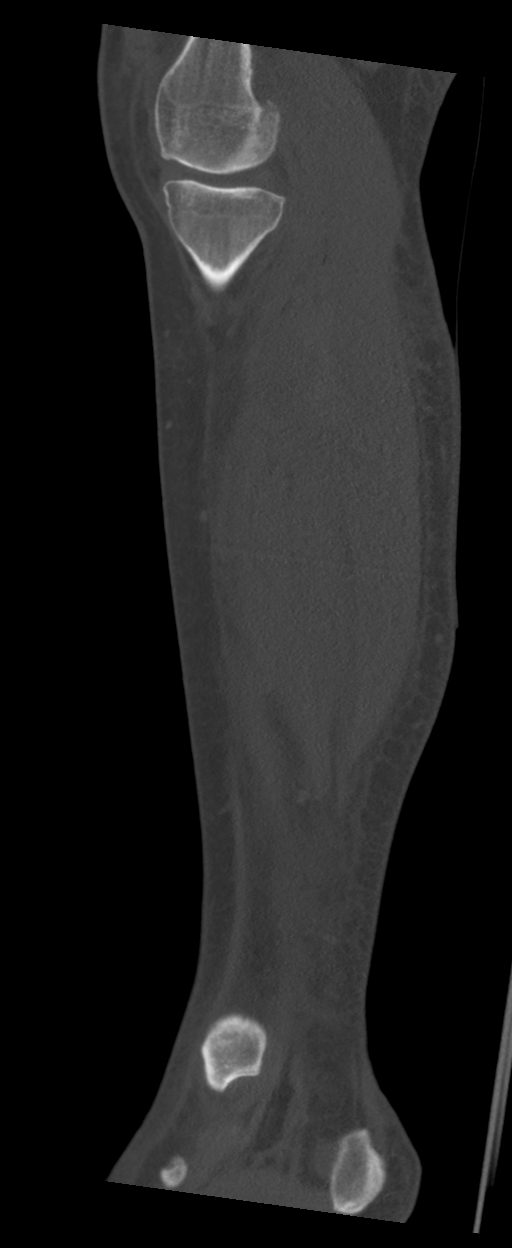
[im 50/99  bone]
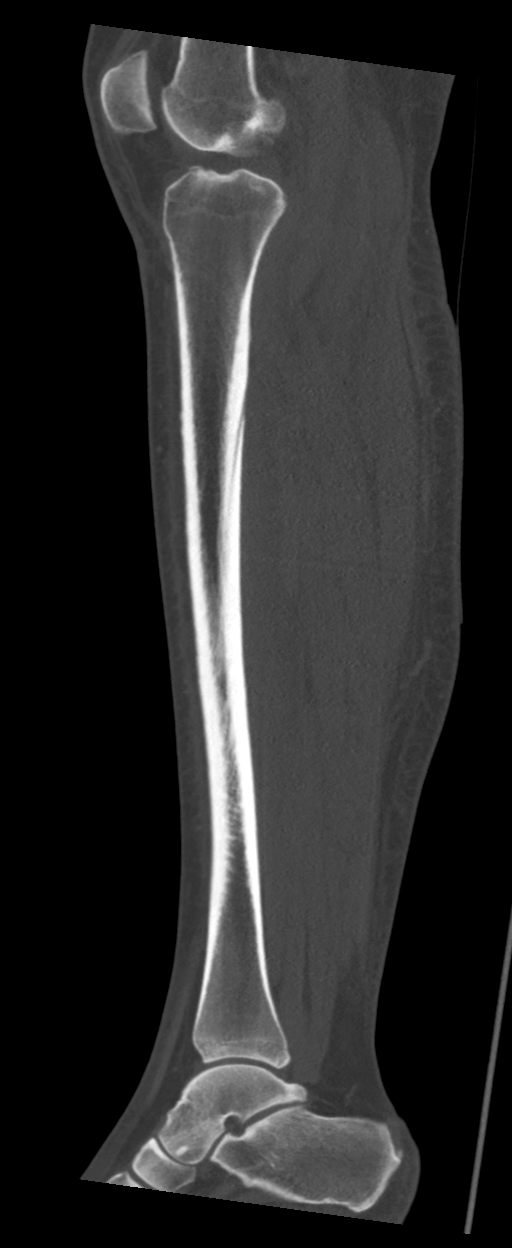
[im 58/99  bone]
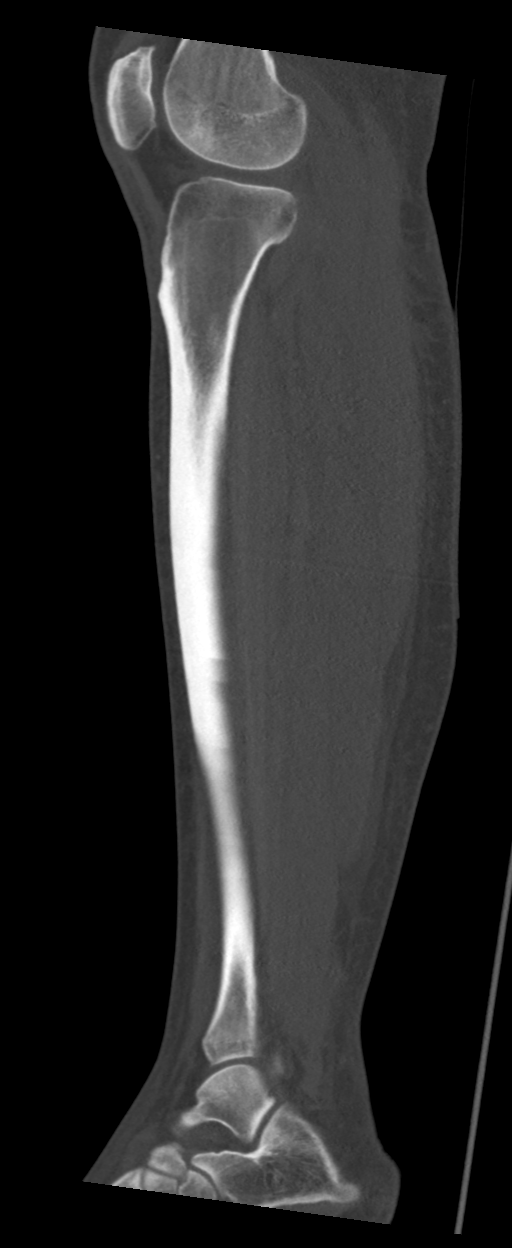
[im 66/99  bone]
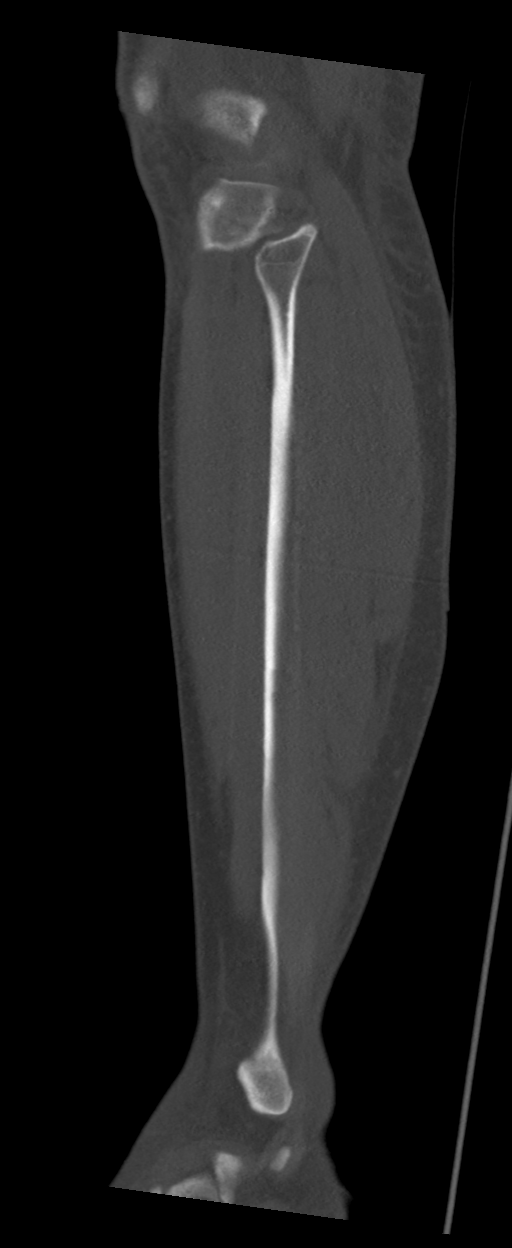

[9 of 33 positions shown; findings below may reference images not displayed]

FINDINGS: Bones/Joint/Cartilage

The tibia and fibula appear normal. There is no evidence of acute
fracture, dislocation or bone destruction. No significant knee or
ankle joint effusion. The joint spaces appear preserved.

Ligaments

Suboptimally assessed by CT.

Muscles and Tendons

Superficial wound medially in the calf without underlying muscular
abnormality or abnormal enhancement.

Soft tissues

There is a linear soft tissue defect within the subcutaneous fat the
medial calf, extending up to 3.6 cm in length on image 64/5. There
is high density within this wound, presumably related to packing or
ointment. There is a small amount of gas in the wound and soft
tissue stranding in the surrounding fat consistent with cellulitis.
No drainable fluid collection or definite foreign body.
IMPRESSION: 1. Superficial wound medially in the calf with internal high
density, presumably related to packing or ointment. Probable
associated mild cellulitis. No drainable fluid collection or
definite foreign body.
2. No evidence of deep soft tissue infection, osteomyelitis or
septic joint.

ADDENDUM:
This case was subsequently reviewed and discussed with [employer]
regarding the possibility of a DVT. No definite DVT is seen,
although vascular opacification is limited on this examination.
Venous Doppler ultrasound recommended if that remains a clinical
concern.

*** End of Addendum ***
FINDINGS: Bones/Joint/Cartilage

The tibia and fibula appear normal. There is no evidence of acute
fracture, dislocation or bone destruction. No significant knee or
ankle joint effusion. The joint spaces appear preserved.

Ligaments

Suboptimally assessed by CT.

Muscles and Tendons

Superficial wound medially in the calf without underlying muscular
abnormality or abnormal enhancement.

Soft tissues

There is a linear soft tissue defect within the subcutaneous fat the
medial calf, extending up to 3.6 cm in length on image 64/5. There
is high density within this wound, presumably related to packing or
ointment. There is a small amount of gas in the wound and soft
tissue stranding in the surrounding fat consistent with cellulitis.
No drainable fluid collection or definite foreign body.
IMPRESSION: 1. Superficial wound medially in the calf with internal high
density, presumably related to packing or ointment. Probable
associated mild cellulitis. No drainable fluid collection or
definite foreign body.
2. No evidence of deep soft tissue infection, osteomyelitis or
septic joint.

## 2021-04-19 IMAGING — US US EXTREM LOW VENOUS*R*
1 series · 13 of 24 positions shown · non-contrast
Comparison: 01/12/2020

CLINICAL DATA: 34-year-old with history of right lower extremity
DVT. Right leg pain.



[Series 1: us venous img lower uni right (dvt) · portal-venous · 13 of 43 slices shown]
[im 1/43]
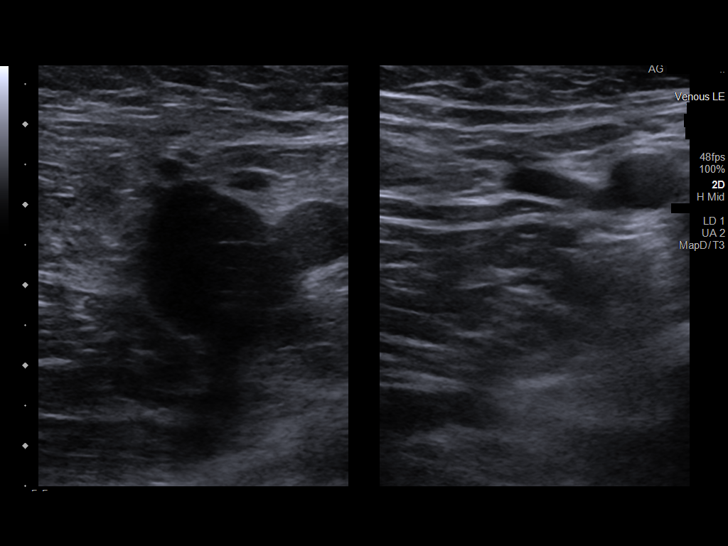
[im 4/43]
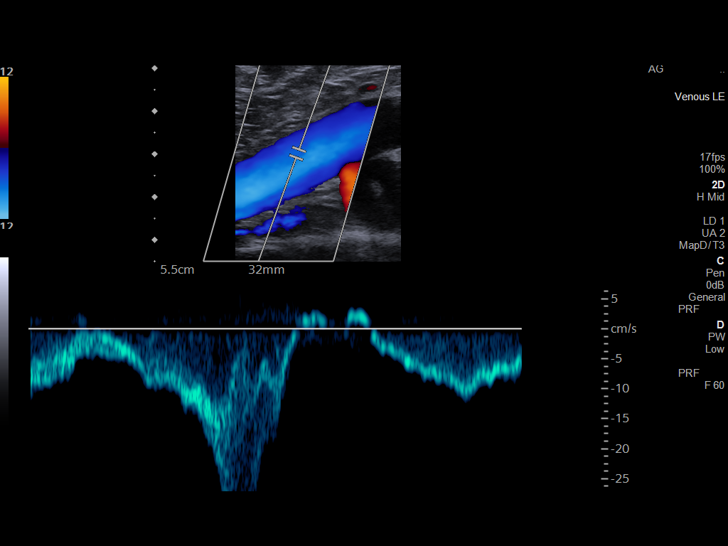
[im 8/43]
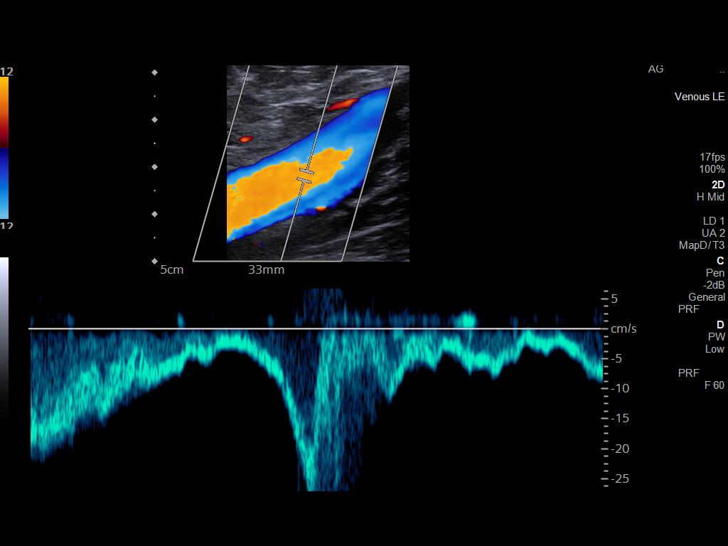
[im 11/43]
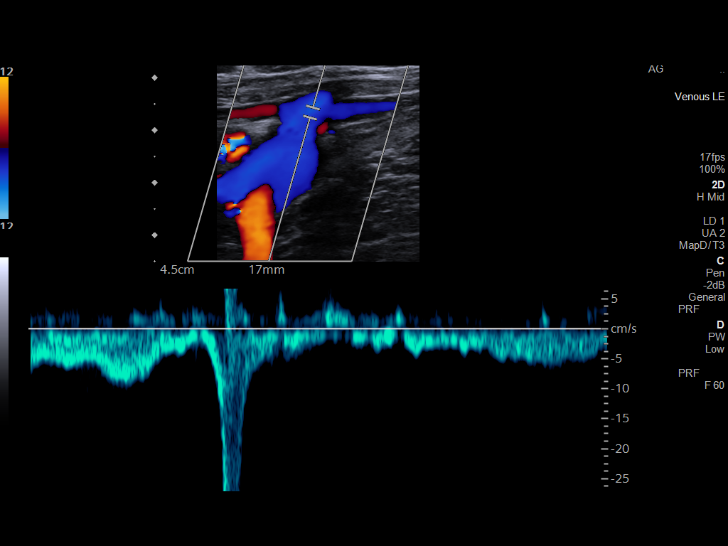
[im 15/43]
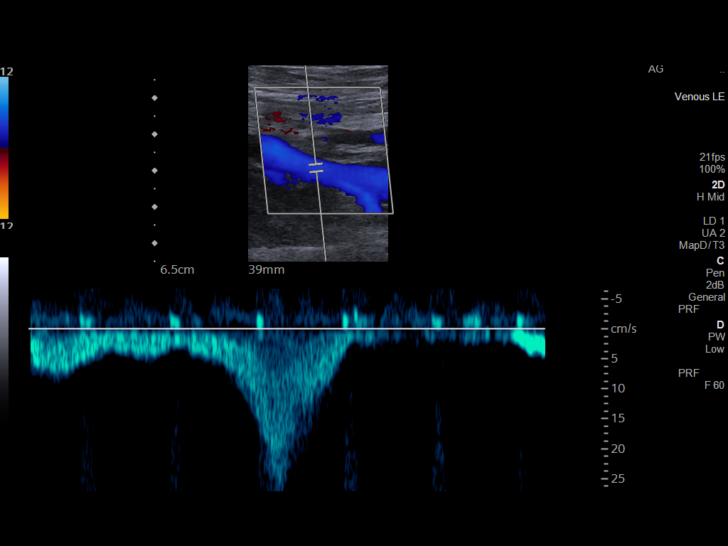
[im 19/43]
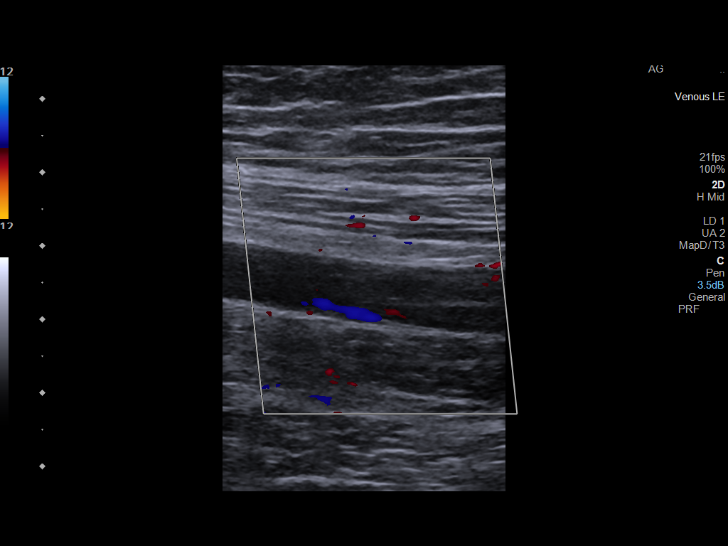
[im 22/43]
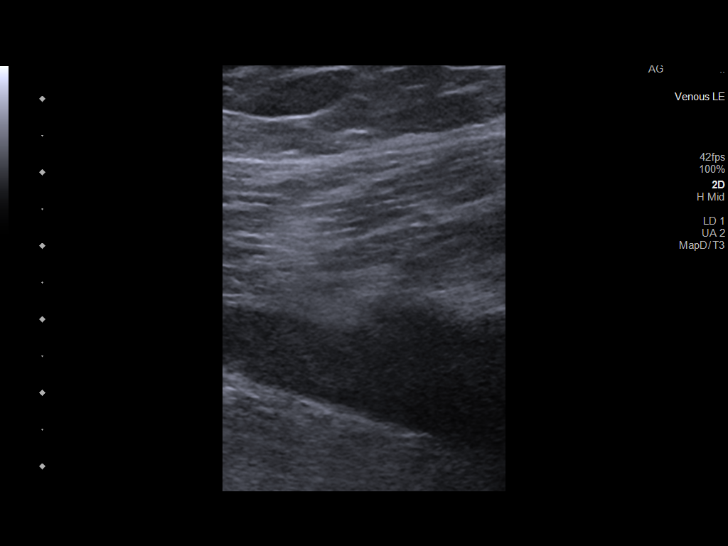
[im 24/43]
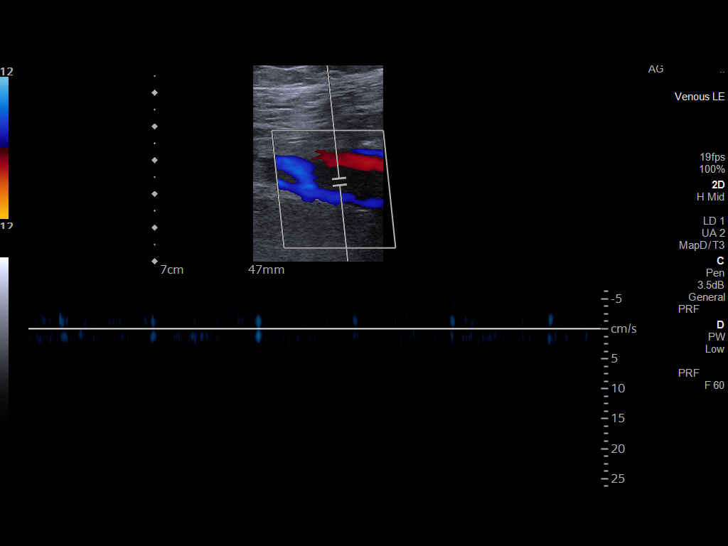
[im 28/43]
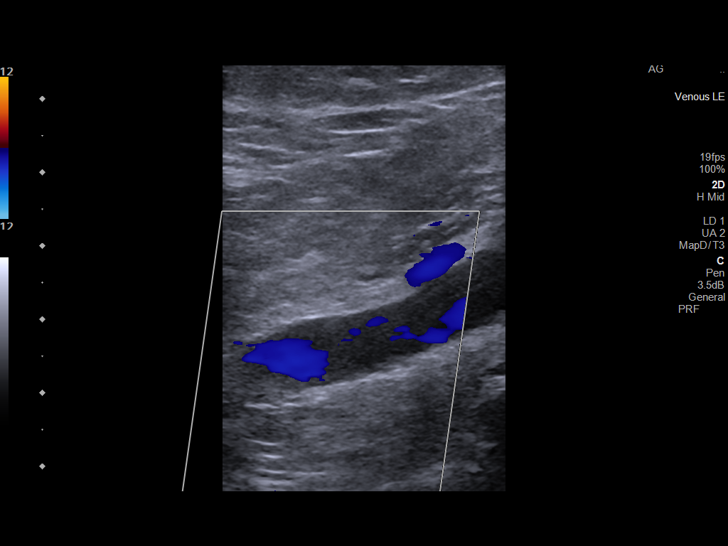
[im 32/43]
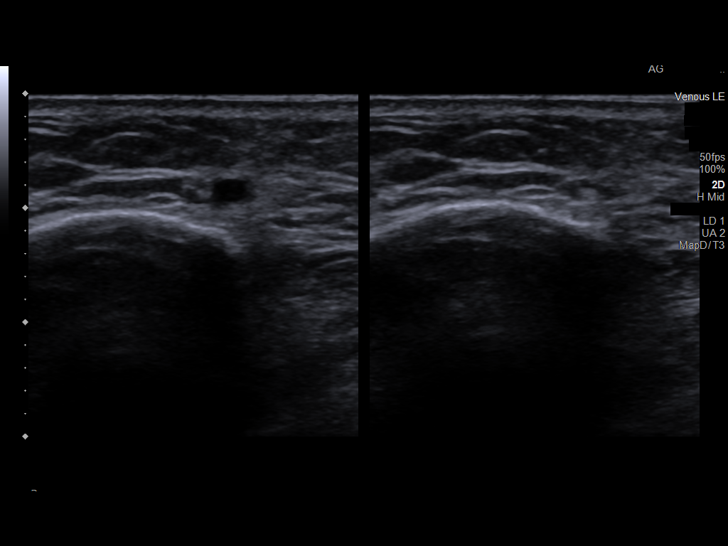
[im 35/43]
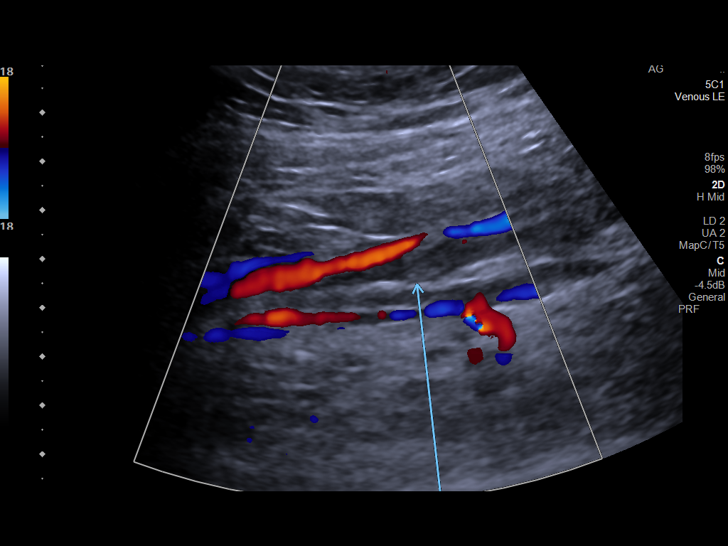
[im 39/43]
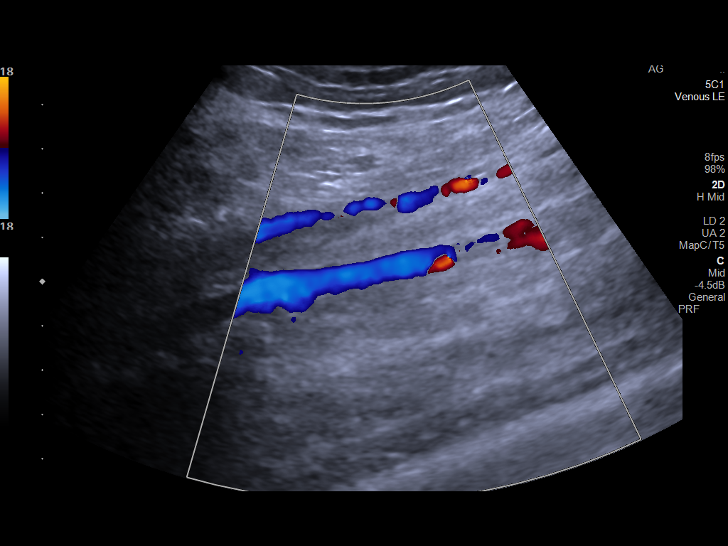
[im 43/43]
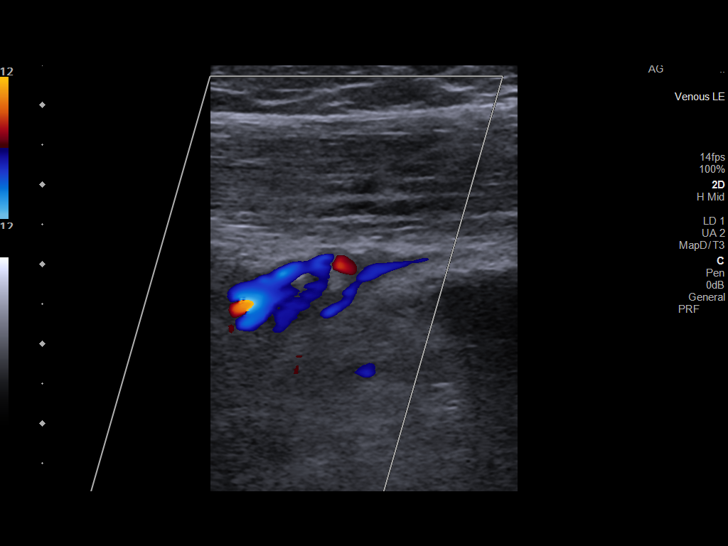

[13 of 24 positions shown; findings below may reference images not displayed]

FINDINGS: Contralateral Common Femoral Vein: Negative for thrombus. Normal
compressibility, color Doppler flow and augmentation.

Common Femoral Vein: Negative for thrombus. Normal compressibility,
color Doppler flow and augmentation.

Saphenofemoral Junction: No evidence of thrombus. Normal
compressibility and flow on color Doppler imaging. Thrombus near the
saphenofemoral junction has resolved since the previous examination.

Profunda Femoral Vein: No evidence of thrombus. Normal
compressibility and flow on color Doppler imaging.

Femoral Vein: Positive for thrombus. Majority of thrombus in the
proximal femoral vein has resolved. Persistent thrombus in the mid
and distal femoral vein. Partial recanalization in the mid and
distal femoral vein since the previous examination.

Popliteal Vein: Positive for thrombus. Persistent thrombus in the
popliteal vein. Thrombus is not completely occlusive.

Calf Veins: Positive for thrombus. Nonocclusive thrombus involving
right posterior tibial veins. Previously posterior tibial vein
thrombus was occlusive. Limited evaluation of the peroneal veins but
there is flow in the peroneal veins.

Superficial Great Saphenous Vein: No evidence of thrombus. Normal
compressibility.

Other Findings:  None.
IMPRESSION: Right lower extremity deep venous thrombosis clot burden has
decreased. Thrombus at the right saphenofemoral junction and
proximal right femoral vein has nearly resolved. Nonocclusive
thrombus remaining in the right mid and distal femoral vein, right
popliteal vein and right deep calf veins.

## 2022-02-05 ENCOUNTER — Emergency Department (HOSPITAL_COMMUNITY)
Admission: EM | Admit: 2022-02-05 | Discharge: 2022-02-05 | Disposition: A | Discharge status: Home / Self Care | Payer: Self-pay | Attending: Emergency Medicine | Admitting: Emergency Medicine

## 2022-02-05 ENCOUNTER — Other Ambulatory Visit: Payer: Self-pay

## 2022-02-05 ENCOUNTER — Encounter (HOSPITAL_COMMUNITY): Payer: Self-pay

## 2022-02-05 DIAGNOSIS — K649 Unspecified hemorrhoids: Secondary | ICD-10-CM

## 2022-02-05 DIAGNOSIS — R42 Dizziness and giddiness: Secondary | ICD-10-CM | POA: Insufficient documentation

## 2022-02-05 DIAGNOSIS — Z7901 Long term (current) use of anticoagulants: Secondary | ICD-10-CM | POA: Insufficient documentation

## 2022-02-05 DIAGNOSIS — K625 Hemorrhage of anus and rectum: Secondary | ICD-10-CM | POA: Insufficient documentation

## 2022-02-05 LAB — CBC WITH DIFFERENTIAL/PLATELET
Abs Immature Granulocytes: 0.02 10*3/uL (ref 0.00–0.07)
Basophils Absolute: 0.1 10*3/uL (ref 0.0–0.1)
Basophils Relative: 1 %
Eosinophils Absolute: 0 10*3/uL (ref 0.0–0.5)
Eosinophils Relative: 1 %
HCT: 50.6 % (ref 39.0–52.0)
Hemoglobin: 17.1 g/dL — ABNORMAL HIGH (ref 13.0–17.0)
Immature Granulocytes: 0 %
Lymphocytes Relative: 8 %
Lymphs Abs: 0.6 10*3/uL — ABNORMAL LOW (ref 0.7–4.0)
MCH: 30 pg (ref 26.0–34.0)
MCHC: 33.8 g/dL (ref 30.0–36.0)
MCV: 88.8 fL (ref 80.0–100.0)
Monocytes Absolute: 0.3 10*3/uL (ref 0.1–1.0)
Monocytes Relative: 4 %
Neutro Abs: 6.6 10*3/uL (ref 1.7–7.7)
Neutrophils Relative %: 86 %
Platelets: 312 10*3/uL (ref 150–400)
RBC: 5.7 MIL/uL (ref 4.22–5.81)
RDW: 12.2 % (ref 11.5–15.5)
WBC: 7.7 10*3/uL (ref 4.0–10.5)
nRBC: 0 % (ref 0.0–0.2)

## 2022-02-05 LAB — COMPREHENSIVE METABOLIC PANEL
ALT: 27 U/L (ref 0–44)
AST: 23 U/L (ref 15–41)
Albumin: 4.5 g/dL (ref 3.5–5.0)
Alkaline Phosphatase: 66 U/L (ref 38–126)
Anion gap: 7 (ref 5–15)
BUN: 16 mg/dL (ref 6–20)
CO2: 25 mmol/L (ref 22–32)
Calcium: 9.3 mg/dL (ref 8.9–10.3)
Chloride: 106 mmol/L (ref 98–111)
Creatinine, Ser: 1.03 mg/dL (ref 0.61–1.24)
GFR, Estimated: >60 mL/min (ref >60)
Glucose, Bld: 134 mg/dL — ABNORMAL HIGH (ref 70–99)
Potassium: 3.6 mmol/L (ref 3.5–5.1)
Sodium: 138 mmol/L (ref 135–145)
Total Bilirubin: 1.6 mg/dL — ABNORMAL HIGH (ref 0.3–1.2)
Total Protein: 7.2 g/dL (ref 6.5–8.1)

## 2022-02-05 LAB — PSA: Prostatic Specific Antigen: 0.22 ng/mL (ref 0.00–4.00)

## 2022-02-05 MED ORDER — SODIUM CHLORIDE 0.9 % IV BOLUS
1000.0000 mL | Freq: Once | INTRAVENOUS | Status: AC
  Administered 2022-02-05: 1000 mL via INTRAVENOUS

## 2022-02-05 MED ORDER — HYDROCORTISONE (PERIANAL) 2.5 % EX CREA: 1.0000 | TOPICAL_CREAM | Freq: Two times a day (BID) | CUTANEOUS | 0 refills | Status: DC

## 2022-02-05 NOTE — ED Triage Notes (Signed)
Pt presents to ED with complaints of bright red blood in stool x couple days. Pt states this am he was dizzy and diaphoretic.

## 2022-02-05 NOTE — ED Provider Notes (Signed)
[facility] EMERGENCY DEPARTMENT Provider Note   CSN: [id number] Arrival date & time: 02/05/22  1342     History  Chief Complaint  Patient presents with   Rectal Bleeding    James Rhodes is a 36 y.o. male.  Patient complains of rectal bleeding.  Patient states he has had bright red blood 3 times since yesterday.  He also had some dizziness.  The history is provided by the patient and medical records. No language interpreter was used.  Rectal Bleeding Quality:  Bright red Timing:  Intermittent Chronicity:  New Context: not anal fissures   Similar prior episodes: no   Relieved by:  Nothing Worsened by:  Nothing Associated symptoms: no abdominal pain        Home Medications Prior to Admission medications   Medication Sig Start Date End Date Taking? Authorizing Provider  amoxicillin-clavulanate (AUGMENTIN) 875-125 MG tablet Take 1 tablet by mouth 2 (two) times daily. 01/09/20   [provider]  APIXABAN Everlene Balls) VTE STARTER PACK (10MG  AND 5MG ) Take as directed on package: start with two-5mg  tablets twice daily for 7 days. On day 8, switch to one-5mg  tablet twice daily. 01/12/20   , PA-C  Ascorbic Acid (VITAMIN C CR) 1000 MG TBCR Take by mouth. 02/02/20   [provider]  doxycycline (VIBRA-TABS) 100 MG tablet Take 100 mg by mouth 2 (two) times daily. 01/24/20   [provider]  HYDROcodone-acetaminophen (NORCO/VICODIN) 5-325 MG tablet Take by mouth. 01/13/20   [provider]  Zinc 50 MG CAPS Take 1 capsule by mouth daily after breakfast. 02/02/20   [provider]      Allergies    Sulfa antibiotics    Review of Systems   Review of Systems  Constitutional:  Negative for appetite change and fatigue.  HENT:  Negative for congestion, ear discharge and sinus pressure.   Eyes:  Negative for discharge.  Respiratory:  Negative for cough.   Cardiovascular:  Negative for chest pain.  Gastrointestinal:  Positive for  hematochezia. Negative for abdominal pain and diarrhea.       Rectal bleeding  Genitourinary:  Negative for frequency and hematuria.  Musculoskeletal:  Negative for back pain.  Skin:  Negative for rash.  Neurological:  Negative for seizures and headaches.  Psychiatric/Behavioral:  Negative for hallucinations.     Physical Exam Updated Vital Signs BP (!) 164/82 (BP Location: Right Arm)   Pulse 86   Temp 98.2 F (36.8 C) (Oral)   Resp 20   Ht 5\' 6"  (1.676 m)   Wt 96.6 kg   SpO2 98%   BMI 34.38 kg/m  Physical Exam Constitutional:      Appearance: He is well-developed.  HENT:     Head: Normocephalic.     Right Ear: Tympanic membrane normal.     Nose: Nose normal.  Eyes:     General: No scleral icterus.    Conjunctiva/sclera: Conjunctivae normal.  Neck:     Thyroid: No thyromegaly.  Cardiovascular:     Rate and Rhythm: Normal rate and regular rhythm.     Heart sounds: No murmur heard.    No friction rub. No gallop.  Pulmonary:     Breath sounds: No stridor. No wheezing or rales.  Chest:     Chest wall: No tenderness.  Abdominal:     General: There is no distension.     Tenderness: There is no abdominal tenderness. There is no rebound.  Genitourinary:    Comments: Heme  positive normal exam Musculoskeletal:        General: Normal range of motion.     Cervical back: Neck supple.  Lymphadenopathy:     Cervical: No cervical adenopathy.  Skin:    Findings: No erythema or rash.  Neurological:     Mental Status: He is alert and oriented to person, place, and time.     Motor: No abnormal muscle tone.     Coordination: Coordination normal.  Psychiatric:        Behavior: Behavior normal.     ED Results / Procedures / Treatments   Labs (all labs ordered are listed, but only abnormal results are displayed) Labs Reviewed  CBC WITH DIFFERENTIAL/PLATELET - Abnormal; Notable for the following components:      Result Value   Hemoglobin 17.1 (*)    Lymphs Abs 0.6 (*)     All other components within normal limits  COMPREHENSIVE METABOLIC PANEL  POC OCCULT BLOOD, ED    EKG None  Radiology No results found.  Procedures Procedures    Medications Ordered in ED Medications  sodium chloride 0.9 % bolus 1,000 mL (1,000 mLs Intravenous Bolus 02/05/22 1451)    ED Course/ Medical Decision Making/ A&P  Hemoccult positive                         Medical Decision Making Amount and/or Complexity of Data Reviewed Labs: ordered.  Risk Prescription drug management.   Patient with GI bleed.  he will be dispositioned by my colleague and if he is stable he will follow-up with GI        labs are unremarkable and patient was discharged home to follow-up with GI        Final Clinical Impression(s) / ED Diagnoses Final diagnoses:  None    Rx / DC Orders ED Discharge Orders     None         Bethann Berkshire, MD 02/07/22 [phone]

## 2022-02-05 NOTE — ED Provider Notes (Signed)
  Physical Exam  BP 131/86   Pulse 69   Temp 98.2 F (36.8 C) (Oral)   Resp 20   Ht 5\' 6"  (1.676 m)   Wt 96.6 kg   SpO2 100%   BMI 34.38 kg/m   Physical Exam Constitutional:      General: He is not in acute distress.    Appearance: Normal appearance.  HENT:     Head: Normocephalic and atraumatic.     Nose: No congestion or rhinorrhea.  Eyes:     General:        Right eye: No discharge.        Left eye: No discharge.     Extraocular Movements: Extraocular movements intact.     Pupils: Pupils are equal, round, and reactive to light.  Cardiovascular:     Rate and Rhythm: Normal rate and regular rhythm.     Heart sounds: No murmur heard. Pulmonary:     Effort: No respiratory distress.     Breath sounds: No wheezing or rales.  Abdominal:     General: There is no distension.     Tenderness: There is no abdominal tenderness.  Genitourinary:    Comments: Multiple external hemorrhoids seen Musculoskeletal:        General: Normal range of motion.     Cervical back: Normal range of motion.  Skin:    General: Skin is warm and dry.  Neurological:     General: No focal deficit present.     Mental Status: He is alert.     Procedures  Procedures  ED Course / MDM    Medical Decision Making Amount and/or Complexity of Data Reviewed Labs: ordered.  Risk Prescription drug management.   Patient received in handoff.  Rectal bleeding pending labs.  Laboratory evaluation largely unremarkable and on my physical exam there are clear external hemorrhoids likely the source of his bleeding.  Patient states his primary concern is that he has prostate cancer and is requesting a PSA of which I ordered.  He was given resources to follow-up with a primary care doctor and outpatient GI.  Patient then discharged with Anusol.       , MD 02/05/22 1600

## 2022-02-25 ENCOUNTER — Emergency Department (HOSPITAL_COMMUNITY)
Admission: EM | Admit: 2022-02-25 | Discharge: 2022-02-25 | Disposition: A | Discharge status: Home / Self Care | Payer: Self-pay | Attending: Emergency Medicine | Admitting: Emergency Medicine

## 2022-02-25 ENCOUNTER — Encounter (HOSPITAL_COMMUNITY): Payer: Self-pay

## 2022-02-25 ENCOUNTER — Other Ambulatory Visit: Payer: Self-pay

## 2022-02-25 DIAGNOSIS — R300 Dysuria: Secondary | ICD-10-CM | POA: Insufficient documentation

## 2022-02-25 DIAGNOSIS — K6289 Other specified diseases of anus and rectum: Secondary | ICD-10-CM | POA: Insufficient documentation

## 2022-02-25 LAB — CBC WITH DIFFERENTIAL/PLATELET
Abs Immature Granulocytes: 0.02 10*3/uL (ref 0.00–0.07)
Basophils Absolute: 0.1 10*3/uL (ref 0.0–0.1)
Basophils Relative: 1 %
Eosinophils Absolute: 0.1 10*3/uL (ref 0.0–0.5)
Eosinophils Relative: 1 %
HCT: 51.5 % (ref 39.0–52.0)
Hemoglobin: 17.4 g/dL — ABNORMAL HIGH (ref 13.0–17.0)
Immature Granulocytes: 0 %
Lymphocytes Relative: 14 %
Lymphs Abs: 0.9 10*3/uL (ref 0.7–4.0)
MCH: 30.6 pg (ref 26.0–34.0)
MCHC: 33.8 g/dL (ref 30.0–36.0)
MCV: 90.7 fL (ref 80.0–100.0)
Monocytes Absolute: 0.4 10*3/uL (ref 0.1–1.0)
Monocytes Relative: 6 %
Neutro Abs: 5.4 10*3/uL (ref 1.7–7.7)
Neutrophils Relative %: 78 %
Platelets: 288 10*3/uL (ref 150–400)
RBC: 5.68 MIL/uL (ref 4.22–5.81)
RDW: 12.1 % (ref 11.5–15.5)
WBC: 6.9 10*3/uL (ref 4.0–10.5)
nRBC: 0 % (ref 0.0–0.2)

## 2022-02-25 LAB — URINALYSIS, ROUTINE W REFLEX MICROSCOPIC
Bilirubin Urine: NEGATIVE
Glucose, UA: NEGATIVE mg/dL
Hgb urine dipstick: NEGATIVE
Ketones, ur: NEGATIVE mg/dL
Leukocytes,Ua: NEGATIVE
Nitrite: NEGATIVE
Protein, ur: NEGATIVE mg/dL
Specific Gravity, Urine: 1.019 (ref 1.005–1.030)
pH: 6 (ref 5.0–8.0)

## 2022-02-25 LAB — COMPREHENSIVE METABOLIC PANEL
ALT: 29 U/L (ref 0–44)
AST: 22 U/L (ref 15–41)
Albumin: 4.4 g/dL (ref 3.5–5.0)
Alkaline Phosphatase: 62 U/L (ref 38–126)
Anion gap: 11 (ref 5–15)
BUN: 15 mg/dL (ref 6–20)
CO2: 26 mmol/L (ref 22–32)
Calcium: 9.9 mg/dL (ref 8.9–10.3)
Chloride: 106 mmol/L (ref 98–111)
Creatinine, Ser: 1.02 mg/dL (ref 0.61–1.24)
GFR, Estimated: >60 mL/min (ref >60)
Glucose, Bld: 127 mg/dL — ABNORMAL HIGH (ref 70–99)
Potassium: 4.2 mmol/L (ref 3.5–5.1)
Sodium: 143 mmol/L (ref 135–145)
Total Bilirubin: 1.6 mg/dL — ABNORMAL HIGH (ref 0.3–1.2)
Total Protein: 7.2 g/dL (ref 6.5–8.1)

## 2022-02-25 NOTE — ED Triage Notes (Signed)
Pt presents to ED with complaints of difficulty urinating, lower back pain, and rectal pressure x several days.

## 2022-02-25 NOTE — ED Provider Notes (Signed)
[facility] EMERGENCY DEPARTMENT Provider Note   CSN: [id number] Arrival date & time: 02/25/22  3875     History  Chief Complaint  Patient presents with   Dysuria    James Rhodes is a 36 y.o. male.  Patient has a history of DVT.  He complains of dysuria and rectal pain  The history is provided by the patient and medical records. No language interpreter was used.  Dysuria Presenting symptoms: dysuria   Context: not after injury   Relieved by:  Nothing Worsened by:  Nothing Ineffective treatments:  None tried Associated symptoms: no abdominal pain, no diarrhea, no hematuria and no urinary frequency   Risk factors: no bladder surgery        Home Medications Prior to Admission medications   Medication Sig Start Date End Date Taking? Authorizing Provider  amoxicillin-clavulanate (AUGMENTIN) 875-125 MG tablet Take 1 tablet by mouth 2 (two) times daily. Patient not taking: Reported on 02/25/2022 01/09/20   [provider]  APIXABAN Everlene Balls) VTE STARTER PACK (10MG  AND 5MG ) Take as directed on package: start with two-5mg  tablets twice daily for 7 days. On day 8, switch to one-5mg  tablet twice daily. Patient not taking: Reported on 02/25/2022 01/12/20   02/27/2022, PA-C  hydrocortisone (ANUSOL-HC) 2.5 % rectal cream Place 1 Application rectally 2 (two) times daily. Patient not taking: Reported on 02/25/2022 02/05/22   Kommor, 02/27/2022, MD      Allergies    Sulfa antibiotics    Review of Systems   Review of Systems  Constitutional:  Negative for appetite change and fatigue.  HENT:  Negative for congestion, ear discharge and sinus pressure.   Eyes:  Negative for discharge.  Respiratory:  Negative for cough.   Cardiovascular:  Negative for chest pain.  Gastrointestinal:  Negative for abdominal pain and diarrhea.  Genitourinary:  Positive for dysuria. Negative for frequency and hematuria.       Occasional rectal discomfort  Musculoskeletal:  Negative for back  pain.  Skin:  Negative for rash.  Neurological:  Negative for seizures and headaches.  Psychiatric/Behavioral:  Negative for hallucinations.     Physical Exam Updated Vital Signs BP (!) 136/100 (BP Location: Left Arm)   Pulse 79   Temp 98.4 F (36.9 C) (Oral)   Resp 18   Ht 5\' 6"  (1.676 m)   Wt 94.8 kg   SpO2 100%   BMI 33.73 kg/m  Physical Exam Vitals and nursing note reviewed.  Constitutional:      Appearance: He is well-developed.  HENT:     Head: Normocephalic.     Nose: Nose normal.  Eyes:     General: No scleral icterus.    Conjunctiva/sclera: Conjunctivae normal.  Neck:     Thyroid: No thyromegaly.  Cardiovascular:     Rate and Rhythm: Normal rate and regular rhythm.     Heart sounds: No murmur heard.    No friction rub. No gallop.  Pulmonary:     Breath sounds: No stridor. No wheezing or rales.  Chest:     Chest wall: No tenderness.  Abdominal:     General: There is no distension.     Tenderness: There is no abdominal tenderness. There is no rebound.  Genitourinary:    Comments: Normal rectal exam Musculoskeletal:        General: Normal range of motion.     Cervical back: Neck supple.  Lymphadenopathy:     Cervical: No cervical adenopathy.  Skin:    Findings:  No erythema or rash.  Neurological:     Mental Status: He is alert and oriented to person, place, and time.     Motor: No abnormal muscle tone.     Coordination: Coordination normal.  Psychiatric:        Behavior: Behavior normal.     ED Results / Procedures / Treatments   Labs (all labs ordered are listed, but only abnormal results are displayed) Labs Reviewed  CBC WITH DIFFERENTIAL/PLATELET - Abnormal; Notable for the following components:      Result Value   Hemoglobin 17.4 (*)    All other components within normal limits  COMPREHENSIVE METABOLIC PANEL - Abnormal; Notable for the following components:   Glucose, Bld 127 (*)    Total Bilirubin 1.6 (*)    All other components within  normal limits  URINE CULTURE  URINALYSIS, ROUTINE W REFLEX MICROSCOPIC    EKG None  Radiology No results found.  Procedures Procedures    Medications Ordered in ED Medications - No data to display  ED Course/ Medical Decision Making/ A&P  Patient's labs are unremarkable.  Rectal exam was normal                         Medical Decision Making Amount and/or Complexity of Data Reviewed Labs: ordered.  This patient presents to the ED for concern of dysuria and rectal pain, this involves an extensive number of treatment options, and is a complaint that carries with it a high risk of complications and morbidity.  The differential diagnosis includes UTI, prostate problem   Co morbidities that complicate the patient evaluation  DVT   Additional history obtained:  Additional history obtained from patient External records from outside source obtained and reviewed including hospital records   Lab Tests:  I Ordered, and personally interpreted labs.  The pertinent results include: CBC and chemistries unremarkable   Imaging Studies ordered:  No imaging Cardiac Monitoring: / EKG:  The patient was maintained on a cardiac monitor.  I personally viewed and interpreted the cardiac monitored which showed an underlying rhythm of: Sinus rhythm   Consultations Obtained:  No consultant Problem List / ED Course / Critical interventions / Medication management  Dysuria, DVT No medicines ordered Reevaluation of the patient after these medicines showed that the patient stayed the same I have reviewed the patients home medicines and have made adjustments as needed   Social Determinants of Health:  None   Test / Admission - Considered:  None  Patient with dysuria.  He will get a UTI and follow-up with urology        Final Clinical Impression(s) / ED Diagnoses Final diagnoses:  Dysuria    Rx / DC Orders ED Discharge Orders     None         Bethann Berkshire, MD 02/25/22 1653

## 2022-02-25 NOTE — Discharge Instructions (Signed)
Make an appointment with [facility] for the next couple weeks for recheck.  We will get a culture of the urine and if it shows infection we will contact you

## 2022-02-26 LAB — URINE CULTURE: Culture: NO GROWTH

## 2022-03-09 ENCOUNTER — Encounter (HOSPITAL_COMMUNITY): Payer: Self-pay | Admitting: *Deleted

## 2022-03-09 ENCOUNTER — Emergency Department (HOSPITAL_COMMUNITY)
Admission: EM | Admit: 2022-03-09 | Discharge: 2022-03-09 | Disposition: A | Discharge status: Home / Self Care | Payer: Self-pay | Attending: Emergency Medicine | Admitting: Emergency Medicine

## 2022-03-09 ENCOUNTER — Other Ambulatory Visit: Payer: Self-pay

## 2022-03-09 DIAGNOSIS — R300 Dysuria: Secondary | ICD-10-CM | POA: Insufficient documentation

## 2022-03-09 DIAGNOSIS — K649 Unspecified hemorrhoids: Secondary | ICD-10-CM | POA: Insufficient documentation

## 2022-03-09 DIAGNOSIS — Z7901 Long term (current) use of anticoagulants: Secondary | ICD-10-CM | POA: Insufficient documentation

## 2022-03-09 LAB — URINALYSIS, ROUTINE W REFLEX MICROSCOPIC
Bilirubin Urine: NEGATIVE
Glucose, UA: NEGATIVE mg/dL
Hgb urine dipstick: NEGATIVE
Ketones, ur: NEGATIVE mg/dL
Leukocytes,Ua: NEGATIVE
Nitrite: NEGATIVE
Protein, ur: NEGATIVE mg/dL
Specific Gravity, Urine: 1.014 (ref 1.005–1.030)
pH: 6 (ref 5.0–8.0)

## 2022-03-09 MED ORDER — PRAMOXINE HCL (PERIANAL) 1 % EX FOAM: 1.0000 | Freq: Three times a day (TID) | CUTANEOUS | 0 refills | Status: AC | PRN

## 2022-03-09 NOTE — ED Provider Notes (Signed)
[facility] EMERGENCY DEPARTMENT Provider Note   CSN: [id number] Arrival date & time: 03/09/22  4010     History  Chief Complaint  Patient presents with   Urinary Frequency    James Rhodes is a 36 y.o. male.   Urinary Frequency     Patient has a prior history of DVT.  Patient presents to the ED with complaints of symptoms ongoing for a month or 2.  Patient states he has some discomfort when he has a bowel movement.  Feels some anal irritation on the left side.  Patient also has urinary urgency and discomfort where he feels like he has to urinate frequently.  Patient denies any vomiting.  No fevers.  No diarrhea.  No trouble with his appetite or eating.  Patient states he came to the ED today because he wanted to figure out what was going on.  Prior records were reviewed and the patient was also evaluated for this complaint on December 13.  He had normal laboratory tests as well as a normal urinalysis at that time  Home Medications Prior to Admission medications   Medication Sig Start Date End Date Taking? Authorizing Provider  pramoxine (PROCTOFOAM) 1 % foam Place 1 Application rectally 3 (three) times daily as needed for anal itching, anal irritation or hemorrhoids. 03/09/22  Yes Linwood Dibbles, MD  amoxicillin-clavulanate (AUGMENTIN) 875-125 MG tablet Take 1 tablet by mouth 2 (two) times daily. Patient not taking: Reported on 02/25/2022 01/09/20   [provider]  APIXABAN Everlene Balls) VTE STARTER PACK (10MG  AND 5MG ) Take as directed on package: start with two-5mg  tablets twice daily for 7 days. On day 8, switch to one-5mg  tablet twice daily. Patient not taking: Reported on 02/25/2022 01/12/20   02/27/2022, PA-C  hydrocortisone (ANUSOL-HC) 2.5 % rectal cream Place 1 Application rectally 2 (two) times daily. Patient not taking: Reported on 02/25/2022 02/05/22   02/27/2022, MD      Allergies    Sulfa antibiotics    Review of Systems   Review of Systems   Genitourinary:  Positive for frequency.    Physical Exam Updated Vital Signs BP (!) 146/96   Pulse 72   Temp 98 F (36.7 C)   Resp 15   Ht 1.676 m (5\' 6" )   Wt 97.5 kg   SpO2 98%   BMI 34.70 kg/m  Physical Exam Vitals and nursing note reviewed.  Constitutional:      General: He is not in acute distress.    Appearance: He is well-developed.  HENT:     Head: Normocephalic and atraumatic.     Right Ear: External ear normal.     Left Ear: External ear normal.  Eyes:     General: No scleral icterus.       Right eye: No discharge.        Left eye: No discharge.     Conjunctiva/sclera: Conjunctivae normal.  Neck:     Trachea: No tracheal deviation.  Cardiovascular:     Rate and Rhythm: Normal rate and regular rhythm.  Pulmonary:     Effort: Pulmonary effort is normal. No respiratory distress.     Breath sounds: Normal breath sounds. No stridor. No wheezing or rales.  Abdominal:     General: Bowel sounds are normal. There is no distension.     Palpations: Abdomen is soft.     Tenderness: There is no abdominal tenderness. There is no guarding or rebound.  Genitourinary:    Comments: Hemorrhoid noted,  no bleeding, no thrombosis Musculoskeletal:        General: No tenderness or deformity.     Cervical back: Neck supple.  Skin:    General: Skin is warm and dry.     Findings: No rash.  Neurological:     General: No focal deficit present.     Mental Status: He is alert.     Cranial Nerves: No cranial nerve deficit, dysarthria or facial asymmetry.     Sensory: No sensory deficit.     Motor: No abnormal muscle tone or seizure activity.     Coordination: Coordination normal.  Psychiatric:        Mood and Affect: Mood normal.     ED Results / Procedures / Treatments   Labs (all labs ordered are listed, but only abnormal results are displayed) Labs Reviewed  URINALYSIS, ROUTINE W REFLEX MICROSCOPIC    EKG None  Radiology No results  found.  Procedures Procedures    Medications Ordered in ED Medications - No data to display  ED Course/ Medical Decision Making/ A&P                           Medical Decision Making Problems Addressed: Dysuria: chronic illness or injury Hemorrhoids, unspecified hemorrhoid type: acute illness or injury  Amount and/or Complexity of Data Reviewed Labs: ordered. Decision-making details documented in ED Course.  Risk OTC drugs.   Patient presented to ED for evaluation urinary discomfort as well as some anal discomfort.  ED workup unremarkable.  Findings not suggestive of acute infection.  Patient does have a hemorrhoid that does not appear thrombosed.  No signs of abscess or infection.    Considering the persistent cyst in nature we will have him follow-up with urologist for further evaluation.  Hemorrhoid cream prescribed.  Evaluation and diagnostic testing in the emergency department does not suggest an emergent condition requiring admission or immediate intervention beyond what has been performed at this time.  The patient is safe for discharge and has been instructed to return immediately for worsening symptoms, change in symptoms or any other concerns.        Final Clinical Impression(s) / ED Diagnoses Final diagnoses:  Hemorrhoids, unspecified hemorrhoid type  Dysuria    Rx / DC Orders ED Discharge Orders          Ordered    pramoxine (PROCTOFOAM) 1 % foam  3 times daily PRN        03/09/22 1002              Dorie Rank, MD 03/09/22 1004

## 2022-03-09 NOTE — Discharge Instructions (Signed)
Take the medication as needed to help with the hemorrhoid.  Follow-up with a urologist for further evaluation of

## 2022-03-09 NOTE — ED Triage Notes (Signed)
Pt c/o urinary frequency that started x one month ago with occasional back pain

## 2022-03-09 NOTE — ED Notes (Signed)
Bladder scan

## 2022-03-16 ENCOUNTER — Other Ambulatory Visit: Payer: Self-pay

## 2022-03-16 ENCOUNTER — Encounter (HOSPITAL_COMMUNITY): Payer: Self-pay | Admitting: Emergency Medicine

## 2022-03-16 ENCOUNTER — Emergency Department (HOSPITAL_COMMUNITY)
Admission: EM | Admit: 2022-03-16 | Discharge: 2022-03-16 | Disposition: A | Discharge status: Home / Self Care | Payer: Self-pay | Attending: Emergency Medicine | Admitting: Emergency Medicine

## 2022-03-16 DIAGNOSIS — M545 Low back pain, unspecified: Secondary | ICD-10-CM | POA: Insufficient documentation

## 2022-03-16 DIAGNOSIS — R8290 Unspecified abnormal findings in urine: Secondary | ICD-10-CM | POA: Insufficient documentation

## 2022-03-16 DIAGNOSIS — Q649 Congenital malformation of urinary system, unspecified: Secondary | ICD-10-CM

## 2022-03-16 LAB — URINALYSIS, ROUTINE W REFLEX MICROSCOPIC
Bilirubin Urine: NEGATIVE
Glucose, UA: NEGATIVE mg/dL
Hgb urine dipstick: NEGATIVE
Ketones, ur: NEGATIVE mg/dL
Leukocytes,Ua: NEGATIVE
Nitrite: NEGATIVE
Protein, ur: NEGATIVE mg/dL
Specific Gravity, Urine: 1.02 (ref 1.005–1.030)
pH: 5 (ref 5.0–8.0)

## 2022-03-16 NOTE — ED Triage Notes (Signed)
Pt her last week for same. C/o urinary frequency, pain across lower back x 1 month. Pt c/o pain to prostate as well. Denies blood in urine. Pt states "I know my blood pressure is going to be high" denies dx of HTN.

## 2022-03-16 NOTE — Discharge Instructions (Addendum)
Schedule to see Urology for evaluation.  I ordered a PSA for evaluation of your prostate.

## 2022-03-16 NOTE — ED Provider Notes (Signed)
  [facility] EMERGENCY DEPARTMENT Provider Note   CSN: [id number] Arrival date & time: 03/16/22  1725     History  Chief Complaint  Patient presents with   Back Pain    James Rhodes is a 37 y.o. male.  Patient is concerned that he has prostate problems.  Patient states that when he urinates he feels like he still has urine in his bladder.  Patient reports he has recently had hemorrhoids and problems with his back.  Patient denies any fever or chills he has not had any cough or congestion he denies any loss of control of urine he denies any constipation.  The history is provided by the patient. No language interpreter was used.       Home Medications Prior to Admission medications   Medication Sig Start Date End Date Taking? Authorizing Provider  pramoxine (PROCTOFOAM) 1 % foam Place 1 Application rectally 3 (three) times daily as needed for anal itching, anal irritation or hemorrhoids. 03/09/22   Dorie Rank, MD      Allergies    Sulfa antibiotics    Review of Systems   Review of Systems  All other systems reviewed and are negative.   Physical Exam Updated Vital Signs BP 135/78 (BP Location: Right Arm)   Pulse 71   Temp 98.5 F (36.9 C) (Oral)   Resp 15   SpO2 98%  Physical Exam Vitals and nursing note reviewed.  Constitutional:      Appearance: He is well-developed.  HENT:     Head: Normocephalic.  Cardiovascular:     Rate and Rhythm: Normal rate.  Pulmonary:     Effort: Pulmonary effort is normal.  Abdominal:     General: There is no distension.  Musculoskeletal:        General: Normal range of motion.     Cervical back: Normal range of motion.  Neurological:     Mental Status: He is alert and oriented to person, place, and time.     ED Results / Procedures / Treatments   Labs (all labs ordered are listed, but only abnormal results are displayed) Labs Reviewed  URINALYSIS, ROUTINE W REFLEX MICROSCOPIC    EKG None  Radiology No results  found.  Procedures Procedures    Medications Ordered in ED Medications - No data to display  ED Course/ Medical Decision Making/ A&P                           Medical Decision Making Patient complains of concerns of prostate problems.  Amount and/or Complexity of Data Reviewed Labs: ordered.    Details: Ordered a PSA patient refused he states that he had a PSA done 2 months ago and that it was normal.  UA is negative  Risk Risk Details: I discussed with the patient his concerns about retained urine and prostate concerns he is advised to schedule to follow-up with urology.           Final Clinical Impression(s) / ED Diagnoses Final diagnoses:  Urinary anomaly    Rx / DC Orders ED Discharge Orders     None      An After Visit Summary was printed and given to the patient.    Fransico Meadow, PA-C 03/16/22 2304    Milton Ferguson, MD 03/18/22 1047

## 2022-03-16 NOTE — ED Notes (Signed)
Pt refused to have PSA drawn.  Pt asked multiple times and explained what the blood was for. Pt verbalized he understood and had PSA drawn a month ago and was normal. EDP made aware of pt refusing PSA to be drawn.

## 2022-03-29 ENCOUNTER — Emergency Department (HOSPITAL_COMMUNITY): Payer: Self-pay

## 2022-03-29 ENCOUNTER — Other Ambulatory Visit: Payer: Self-pay

## 2022-03-29 ENCOUNTER — Emergency Department (HOSPITAL_COMMUNITY)
Admission: EM | Admit: 2022-03-29 | Discharge: 2022-03-29 | Disposition: A | Discharge status: Home / Self Care | Payer: Self-pay | Attending: Emergency Medicine | Admitting: Emergency Medicine

## 2022-03-29 ENCOUNTER — Encounter (HOSPITAL_COMMUNITY): Payer: Self-pay

## 2022-03-29 DIAGNOSIS — Z87891 Personal history of nicotine dependence: Secondary | ICD-10-CM | POA: Insufficient documentation

## 2022-03-29 DIAGNOSIS — R300 Dysuria: Secondary | ICD-10-CM | POA: Insufficient documentation

## 2022-03-29 DIAGNOSIS — K8689 Other specified diseases of pancreas: Secondary | ICD-10-CM | POA: Insufficient documentation

## 2022-03-29 DIAGNOSIS — R1030 Lower abdominal pain, unspecified: Secondary | ICD-10-CM | POA: Insufficient documentation

## 2022-03-29 LAB — COMPREHENSIVE METABOLIC PANEL
ALT: 31 U/L (ref 0–44)
AST: 28 U/L (ref 15–41)
Albumin: 4.5 g/dL (ref 3.5–5.0)
Alkaline Phosphatase: 84 U/L (ref 38–126)
Anion gap: 10 (ref 5–15)
BUN: 15 mg/dL (ref 6–20)
CO2: 28 mmol/L (ref 22–32)
Calcium: 9.3 mg/dL (ref 8.9–10.3)
Chloride: 101 mmol/L (ref 98–111)
Creatinine, Ser: 1.11 mg/dL (ref 0.61–1.24)
GFR, Estimated: >60 mL/min (ref >60)
Glucose, Bld: 123 mg/dL — ABNORMAL HIGH (ref 70–99)
Potassium: 3.8 mmol/L (ref 3.5–5.1)
Sodium: 139 mmol/L (ref 135–145)
Total Bilirubin: 1.1 mg/dL (ref 0.3–1.2)
Total Protein: 7.4 g/dL (ref 6.5–8.1)

## 2022-03-29 LAB — URINALYSIS, ROUTINE W REFLEX MICROSCOPIC
Bilirubin Urine: NEGATIVE
Glucose, UA: NEGATIVE mg/dL
Hgb urine dipstick: NEGATIVE
Ketones, ur: NEGATIVE mg/dL
Leukocytes,Ua: NEGATIVE
Nitrite: NEGATIVE
Protein, ur: NEGATIVE mg/dL
Specific Gravity, Urine: 1.017 (ref 1.005–1.030)
pH: 5 (ref 5.0–8.0)

## 2022-03-29 LAB — LIPASE, BLOOD: Lipase: 47 U/L (ref 11–51)

## 2022-03-29 LAB — CBC WITH DIFFERENTIAL/PLATELET
Abs Immature Granulocytes: 0.04 10*3/uL (ref 0.00–0.07)
Basophils Absolute: 0.1 10*3/uL (ref 0.0–0.1)
Basophils Relative: 1 %
Eosinophils Absolute: 0.1 10*3/uL (ref 0.0–0.5)
Eosinophils Relative: 1 %
HCT: 52.5 % — ABNORMAL HIGH (ref 39.0–52.0)
Hemoglobin: 17.2 g/dL — ABNORMAL HIGH (ref 13.0–17.0)
Immature Granulocytes: 1 %
Lymphocytes Relative: 27 %
Lymphs Abs: 1.7 10*3/uL (ref 0.7–4.0)
MCH: 29.9 pg (ref 26.0–34.0)
MCHC: 32.8 g/dL (ref 30.0–36.0)
MCV: 91.3 fL (ref 80.0–100.0)
Monocytes Absolute: 0.5 10*3/uL (ref 0.1–1.0)
Monocytes Relative: 8 %
Neutro Abs: 4 10*3/uL (ref 1.7–7.7)
Neutrophils Relative %: 62 %
Platelets: 295 10*3/uL (ref 150–400)
RBC: 5.75 MIL/uL (ref 4.22–5.81)
RDW: 11.8 % (ref 11.5–15.5)
WBC: 6.4 10*3/uL (ref 4.0–10.5)
nRBC: 0 % (ref 0.0–0.2)

## 2022-03-29 MED ORDER — IOHEXOL 300 MG/ML  SOLN
100.0000 mL | Freq: Once | INTRAMUSCULAR | Status: AC | PRN
  Administered 2022-03-29: 100 mL via INTRAVENOUS

## 2022-03-29 NOTE — ED Provider Notes (Addendum)
[facility] EMERGENCY DEPARTMENT Provider Note   CSN: [id number] Arrival date & time: 03/29/22  0700     History  Chief Complaint  Patient presents with   Groin Pain   Back Pain    James Rhodes is a 37 y.o. male.  Patient presenting with a complaint of urinary frequency some dysuria and lower quadrant abdominal pain.  Patient states this has been ongoing since Thanksgiving.  Patient has been seen multiple times since that time.  Most recently seen December 13 December 25 and January 1.  Patient's had negative urinalysis with those visits.  Patient has not had any imaging done.  And has not had any lab work other than urinalysis recently.  Patient was referred to urology but he has not made an appointment to see them.  Patient denies any nausea vomiting diarrhea.  Denies any fevers.  Does state that there is some low back pain associated with it.  Denies any numbness or weakness to the legs or any leg swelling.  Past medical history significant for deep vein thrombosis.  Patient is a former cigarette smoker.       Home Medications Prior to Admission medications   Medication Sig Start Date End Date Taking? Authorizing Provider  pramoxine (PROCTOFOAM) 1 % foam Place 1 Application rectally 3 (three) times daily as needed for anal itching, anal irritation or hemorrhoids. 03/09/22   Linwood Dibbles, MD      Allergies    Sulfa antibiotics    Review of Systems   Review of Systems  Constitutional:  Negative for chills and fever.  HENT:  Negative for ear pain and sore throat.   Eyes:  Negative for pain and visual disturbance.  Respiratory:  Negative for cough and shortness of breath.   Cardiovascular:  Negative for chest pain and palpitations.  Gastrointestinal:  Positive for abdominal pain. Negative for nausea and vomiting.  Genitourinary:  Positive for dysuria and frequency. Negative for hematuria.  Musculoskeletal:  Positive for back pain. Negative for arthralgias.  Skin:  Negative for  color change and rash.  Neurological:  Negative for seizures and syncope.  All other systems reviewed and are negative.   Physical Exam Updated Vital Signs BP (!) 145/99 (BP Location: Left Arm)   Pulse (!) 105   Temp 98.4 F (36.9 C) (Oral)   Resp 18   Ht 1.702 m (5\' 7" )   Wt 99.8 kg   SpO2 100%   BMI 34.46 kg/m  Physical Exam Vitals and nursing note reviewed.  Constitutional:      General: He is not in acute distress.    Appearance: Normal appearance. He is well-developed.  HENT:     Head: Normocephalic and atraumatic.  Eyes:     Extraocular Movements: Extraocular movements intact.     Conjunctiva/sclera: Conjunctivae normal.     Pupils: Pupils are equal, round, and reactive to light.  Cardiovascular:     Rate and Rhythm: Normal rate and regular rhythm.     Heart sounds: No murmur heard. Pulmonary:     Effort: Pulmonary effort is normal. No respiratory distress.     Breath sounds: Normal breath sounds.  Abdominal:     Palpations: Abdomen is soft. There is no mass.     Tenderness: There is no abdominal tenderness. There is no guarding.     Comments: Abdomen soft nontender bladder is not distended.  Genitourinary:    Comments: GU normal penis testes nontender no mass.  No hernia.  No discharge.  Patient is circumcised. Musculoskeletal:        General: No swelling.     Cervical back: Normal range of motion and neck supple.  Skin:    General: Skin is warm and dry.     Capillary Refill: Capillary refill takes less than 2 seconds.  Neurological:     General: No focal deficit present.     Mental Status: He is alert and oriented to person, place, and time.  Psychiatric:        Mood and Affect: Mood normal.     ED Results / Procedures / Treatments   Labs (all labs ordered are listed, but only abnormal results are displayed) Labs Reviewed  COMPREHENSIVE METABOLIC PANEL - Abnormal; Notable for the following components:      Result Value   Glucose, Bld 123 (*)    All  other components within normal limits  CBC WITH DIFFERENTIAL/PLATELET - Abnormal; Notable for the following components:   Hemoglobin 17.2 (*)    HCT 52.5 (*)    All other components within normal limits  URINALYSIS, ROUTINE W REFLEX MICROSCOPIC  LIPASE, BLOOD    EKG None  Radiology CT Abdomen Pelvis W Contrast  Result Date: 03/29/2022 CLINICAL DATA:  37 year old male with history of left lower quadrant abdominal pain. EXAM: CT ABDOMEN AND PELVIS WITH CONTRAST TECHNIQUE: Multidetector CT imaging of the abdomen and pelvis was performed using the standard protocol following bolus administration of intravenous contrast. RADIATION DOSE REDUCTION: This exam was performed according to the departmental dose-optimization program which includes automated exposure control, adjustment of the mA and/or kV according to patient size and/or use of iterative reconstruction technique. CONTRAST:  157mL OMNIPAQUE IOHEXOL 300 MG/ML  SOLN COMPARISON:  No priors. FINDINGS: Lower chest: Unremarkable. Hepatobiliary: Subcentimeter low-attenuation lesion in segment 6 of the liver (axial image 31 of series 2), too small to definitively characterize. No aggressive appearing hepatic lesions are noted. No intra or extrahepatic biliary ductal dilatation. Gallbladder is normal in appearance. Pancreas: In the pancreatic head (axial image 34 of series 2) there is a rounded mass-like contour abnormality, without definite altered perfusion to clearly indicate the presence of an underlying mass. No pancreatic ductal dilatation. No pancreatic or peripancreatic fluid collections or inflammatory changes. Spleen: Unremarkable. Adrenals/Urinary Tract: Bilateral kidneys and bilateral adrenal glands are normal in appearance. No hydroureteronephrosis. Urinary bladder is normal in appearance. Stomach/Bowel: The appearance of the stomach is unremarkable. No pathologic dilatation of small bowel or colon. Multiple colonic diverticula are noted, most  evident in the descending colon, without surrounding inflammatory changes to indicate an acute diverticulitis at this time. Normal appendix. Vascular/Lymphatic: No significant atherosclerotic disease, aneurysm or dissection noted in the abdominal or pelvic vasculature. No definite lymphadenopathy noted in the abdomen or pelvis. Specifically, no definite peripancreatic lymphadenopathy. Reproductive: Prostate gland and seminal vesicles are unremarkable in appearance. Other: No significant volume of ascites.  No pneumoperitoneum. Musculoskeletal: There are no aggressive appearing lytic or blastic lesions noted in the visualized portions of the skeleton. IMPRESSION: 1. No acute findings are noted in the abdomen or pelvis to account for the patient's symptoms. 2. There is a prominent rounded contour in the pancreatic head. This is favored to a normal variant with benign pancreatic tissue, however, the possibility of pancreatic neoplasm is not entirely excluded given the appearance on today's examination. There is also a subcentimeter low-attenuation lesion in segment 6 of the liver which is too small to definitively characterize. Although this hepatic lesion is favored to represent a tiny  small cyst or biliary hamartoma, given the presence of the contour abnormality in the pancreatic head, the possibility of a metastatic lesion to the pancreas is not entirely excluded. Follow-up nonemergent outpatient abdominal MRI with and without IV gadolinium with MRCP is recommended in the near future to definitively evaluate these regions and exclude underlying malignancy. 3. Colonic diverticulosis without evidence of acute diverticulitis at this time. Electronically Signed   By: Vinnie Langton M.D.   On: 03/29/2022 09:04    Procedures Procedures    Medications Ordered in ED Medications  iohexol (OMNIPAQUE) 300 MG/ML solution 100 mL (100 mLs Intravenous Contrast Given 03/29/22 0845)    ED Course/ Medical Decision Making/  A&P                             Medical Decision Making Amount and/or Complexity of Data Reviewed Labs: ordered. Radiology: ordered.  Risk Prescription drug management.   Will get full labs here today to include CBC complete metabolic panel lipase urinalysis and will do CT scan abdomen and pelvis for further evaluation if negative will recommend strongly the patient follow-up with urology for further evaluation.  Labs without significant abnormalities.  Liver function tests normal.  Urinalysis normal.  No leukocytosis hemoglobin actually a little concentrated at 17.1.  Lipase also normal at 47.  CT scan of the abdomen without anything to explain the lower quadrant complaint or the GU complaint.  However there is incidental findings in the pancreas area and liver area that they are recommending outpatient abdominal MRI with MRCP.  Will refer patient to gastroenterology for further evaluation of these findings.  And also will give him referral to urology outpatient.  Patient nontoxic no acute distress.   Final Clinical Impression(s) / ED Diagnoses Final diagnoses:  Lower abdominal pain  Pancreatic mass  Dysuria    Rx / DC Orders ED Discharge Orders     None         Fredia Sorrow, MD 03/29/22 6834    Fredia Sorrow, MD 03/29/22 [place], [place], MD 03/29/22 [phone]

## 2022-03-29 NOTE — ED Triage Notes (Signed)
Pt c/o urinary frequency and feels he is not empty his bladder fully since Thanksgiving. Pt also c/o groin and lower abd pain. Pt c/o lower mid back pain since Thanksgiving also and had been seen and referred to specialist but unable to get to see one.

## 2022-03-29 NOTE — Discharge Instructions (Signed)
Workup today without any acute findings to explain lower abdominal complaint and genitourinary complaint.  Recommend follow-up with urology.  Information provided above.  Call and set up an appointment.  Incidental findings on the CT scan of the pancreas which is high in the abdomen and also with the liver will require additional outpatient workup through gastroenterology.  Most likely will require MRI type studies.  Very important that you call and set up an appointment with gastroenterology.  Labs here today very normal, which is reassuring.

## 2022-03-30 ENCOUNTER — Telehealth: Payer: Self-pay

## 2022-03-30 NOTE — Telephone Encounter (Signed)
Received patient's information from [facility]. Attempted to inform patient about the [facility] Connect program. No answer. I left the patient a voicemail. The patient will also receive a brochure in the mail about our program.

## 2022-03-31 ENCOUNTER — Encounter (HOSPITAL_COMMUNITY): Payer: Self-pay | Admitting: *Deleted

## 2022-03-31 ENCOUNTER — Emergency Department (HOSPITAL_COMMUNITY)
Admission: EM | Admit: 2022-03-31 | Discharge: 2022-03-31 | Disposition: A | Discharge status: Home / Self Care | Payer: Self-pay | Attending: Emergency Medicine | Admitting: Emergency Medicine

## 2022-03-31 ENCOUNTER — Other Ambulatory Visit: Payer: Self-pay

## 2022-03-31 DIAGNOSIS — R1031 Right lower quadrant pain: Secondary | ICD-10-CM | POA: Insufficient documentation

## 2022-03-31 DIAGNOSIS — M545 Low back pain, unspecified: Secondary | ICD-10-CM | POA: Insufficient documentation

## 2022-03-31 DIAGNOSIS — R3911 Hesitancy of micturition: Secondary | ICD-10-CM | POA: Insufficient documentation

## 2022-03-31 DIAGNOSIS — R1032 Left lower quadrant pain: Secondary | ICD-10-CM | POA: Insufficient documentation

## 2022-03-31 MED ORDER — METHOCARBAMOL 500 MG PO TABS: 500.0000 mg | ORAL_TABLET | Freq: Three times a day (TID) | ORAL | 0 refills | Status: AC

## 2022-03-31 MED ORDER — IBUPROFEN 800 MG PO TABS: 800.0000 mg | ORAL_TABLET | Freq: Three times a day (TID) | ORAL | 0 refills | Status: AC

## 2022-03-31 NOTE — ED Triage Notes (Signed)
Pt has multiple complaints and states he feels like he can't empty his bladder; pt has some pain at the base of his penis and lower back pain

## 2022-03-31 NOTE — ED Notes (Signed)
Bladder scan- PVR 48ml

## 2022-03-31 NOTE — Discharge Instructions (Signed)
Your symptoms may be coming from your lower back.  Please take the prescription ibuprofen and muscle relaxer as directed.  You may also alternate ice and heat to your lower back.  As discussed, I recommend that you contact the urologist listed to arrange a follow-up appointment.  Also as recommended on your previous ER visit please make an appointment to see the GI doctor as well.

## 2022-03-31 NOTE — ED Provider Notes (Signed)
[facility] EMERGENCY DEPARTMENT Provider Note   CSN: [id number] Arrival date & time: 03/31/22  1334     History  Chief Complaint  Patient presents with   Back Pain    James Rhodes is a 37 y.o. male.   Back Pain Associated symptoms: no abdominal pain, no chest pain, no dysuria, no fever, no numbness and no weakness        James Rhodes is a 37 y.o. male who presents to the Emergency Department complaining of low back pain and urinary hesitancy.  Symptoms have been present for some time and he has had had multiple ER visits for same.  He describes achy pain of his lower back and some mild pain at the base of his penis.  He denies any burning with urination genital lesions or penile discharge, but states he feels that he does not completely empty his bladder.  He was seen here 2 days prior for similar symptoms and had CT of the abdomen and pelvis without cause for patient's symptoms.  He was noted to have an abnormal finding of his pancreas and states he is planning to follow-up with GI regarding that issue.  He states he is not currently sexually active and has not been sexually active for several months.  Home Medications Prior to Admission medications   Medication Sig Start Date End Date Taking? Authorizing Provider  pramoxine (PROCTOFOAM) 1 % foam Place 1 Application rectally 3 (three) times daily as needed for anal itching, anal irritation or hemorrhoids. 03/09/22   Dorie Rank, MD      Allergies    Sulfa antibiotics    Review of Systems   Review of Systems  Constitutional:  Negative for chills and fever.  Respiratory:  Negative for shortness of breath.   Cardiovascular:  Negative for chest pain.  Gastrointestinal:  Negative for abdominal pain, nausea and vomiting.  Genitourinary:  Positive for difficulty urinating and penile pain. Negative for decreased urine volume, dysuria, flank pain, genital sores, hematuria, penile discharge, penile swelling, scrotal swelling and  testicular pain.  Musculoskeletal:  Positive for back pain.  Skin:  Negative for rash.  Neurological:  Negative for weakness and numbness.    Physical Exam Updated Vital Signs BP (!) 155/107   Pulse 95   Temp 98.1 F (36.7 C) (Oral)   Resp 18   Ht 5\' 7"  (1.702 m)   Wt 99.7 kg   SpO2 99%   BMI 34.43 kg/m  Physical Exam Vitals and nursing note reviewed. Exam conducted with a chaperone present.  Constitutional:      General: He is not in acute distress.    Appearance: Normal appearance. He is not ill-appearing or toxic-appearing.  HENT:     Mouth/Throat:     Mouth: Mucous membranes are moist.     Pharynx: Oropharynx is clear.  Cardiovascular:     Rate and Rhythm: Normal rate and regular rhythm.     Pulses: Normal pulses.  Pulmonary:     Effort: Pulmonary effort is normal.     Breath sounds: Normal breath sounds.  Chest:     Chest wall: No tenderness.  Abdominal:     General: There is no distension.     Palpations: Abdomen is soft.     Tenderness: There is no abdominal tenderness. There is no right CVA tenderness or left CVA tenderness.     Hernia: There is no hernia in the left inguinal area or right inguinal area.  Genitourinary:    Penis:  Normal. No phimosis, paraphimosis, tenderness, discharge, swelling or lesions.      Testes: Normal. Cremasteric reflex is present.        Right: Mass, tenderness or swelling not present.        Left: Mass, tenderness or swelling not present.     Epididymis:     Right: Not enlarged. No mass or tenderness.     Left: Not enlarged. No mass or tenderness.     Prostate: Not enlarged, not tender and no nodules present.     Rectum: No tenderness or external hemorrhoid. Normal anal tone.     Comments: Exam chaperoned by male nursing staff.  No palpable masses or tenderness of the bilateral testes or epididymis Musculoskeletal:        General: Normal range of motion.  Lymphadenopathy:     Lower Body: No right inguinal adenopathy. No left  inguinal adenopathy.  Skin:    General: Skin is warm.     Capillary Refill: Capillary refill takes less than 2 seconds.     Findings: No erythema or rash.  Neurological:     General: No focal deficit present.     Mental Status: He is alert.     Sensory: No sensory deficit.     Motor: No weakness.     ED Results / Procedures / Treatments   Labs (all labs ordered are listed, but only abnormal results are displayed) Labs Reviewed  GC/CHLAMYDIA PROBE AMP ([place]) NOT AT [facility]    EKG None  Radiology No results found.  Procedures Procedures    Medications Ordered in ED Medications - No data to display  ED Course/ Medical Decision Making/ A&P                             Medical Decision Making Patient here with complaint of low back pain, tenderness at the base of his penis and urinary hesitancy.  States symptoms have been present for some time.  He has had multiple ER visits for same without determined cause.  He was here 2 days ago had CT of the abdomen and pelvis without determined cause, abnormality of pancreas found, likely incidental, patient planning to follow-up with GI regarding this.  Exam was performed by me with male chaperone, no palpable masses or tenderness to either epididymis or testicle.  No penile discharge noted, no inguinal tenderness or palpable hernias.  No rash or lesions of the penis or perineum.  No palpable tenderness or enlargement of the prostate on rectal exam.  Cause for patient's symptoms are unclear.  He does not have any numbness or weakness of the lower extremities, no saddle anesthesias, no midline tenderness of the lower lumbar spine.  He is ambulatory with steady gait.  No focal neurodeficits.  Given duration of patient's symptoms, do not feel this is emergent process.  Will check urinalysis, GC chlamydia, no indication for repeat imaging at this time  Amount and/or Complexity of Data Reviewed External Data Reviewed:     Details: CT  abdomen and pelvis from 03/29/2022 shows  no acute findings are noted in the abdomen or pelvis to account for the patient's symptoms. 2. There is a prominent rounded contour in the pancreatic head. This is favored to a normal variant with benign pancreatic tissue, however, the possibility of pancreatic neoplasm is not entirely excluded   Labs: ordered.    Details: GC and chlamydia results pending Discussion of management or test  interpretation with external provider(s): PVR was 77 mL  Exam today without clear cause as to patient's symptoms.  Doubt emergent process.  No indication or red flags suggestive of cauda equina.  No clinical findings of the prostate or epididymis.  Patient felt to be appropriate for discharge home, I recommended close outpatient follow-up with urology.  He is agreeable to plan.  Risk Prescription drug management.           Final Clinical Impression(s) / ED Diagnoses Final diagnoses:  Bilateral groin pain  Acute bilateral low back pain without sciatica    Rx / DC Orders ED Discharge Orders     None         Kem Parkinson, PA-C 04/03/22 1439    Milton Ferguson, MD 04/07/22 [phone]

## 2022-04-01 LAB — GC/CHLAMYDIA PROBE AMP (~~LOC~~) NOT AT ARMC
Chlamydia: NEGATIVE
Comment: NEGATIVE
Comment: NORMAL
Neisseria Gonorrhea: NEGATIVE

## 2022-04-13 ENCOUNTER — Ambulatory Visit (INDEPENDENT_AMBULATORY_CARE_PROVIDER_SITE_OTHER): Payer: Self-pay | Admitting: Urology

## 2022-04-13 VITALS — BP 157/106 | HR 93 | Ht 67.0 in | Wt 225.0 lb

## 2022-04-13 DIAGNOSIS — R350 Frequency of micturition: Secondary | ICD-10-CM

## 2022-04-13 DIAGNOSIS — R3911 Hesitancy of micturition: Secondary | ICD-10-CM

## 2022-04-13 LAB — BLADDER SCAN AMB NON-IMAGING: Scan Result: 0

## 2022-04-13 LAB — URINALYSIS, ROUTINE W REFLEX MICROSCOPIC
Bilirubin, UA: NEGATIVE
Glucose, UA: NEGATIVE
Leukocytes,UA: NEGATIVE
Nitrite, UA: NEGATIVE
Protein,UA: NEGATIVE
RBC, UA: NEGATIVE
Specific Gravity, UA: 1.015 (ref 1.005–1.030)
Urobilinogen, Ur: 0.2 mg/dL (ref 0.2–1.0)
pH, UA: 6 (ref 5.0–7.5)

## 2022-04-13 NOTE — Progress Notes (Unsigned)
04/13/2022 2:12 PM   James Rhodes [birth year] [id number]  Referring provider: No referring provider defined for this encounter.  No chief complaint on file.   HPI:  New pt -   1) urinary hesitancy - He has some hesitancy. He has a good stream at time and sometimes slower. He doesn't feel empty. Has some PV dribble. Sometimes a dull SP ache. Going on for a few years. No hip or back issues. No GU surgery. No NG risk. AUASS = 20. No constipation.   UA negative, STI negative, Cr 1.1. Jan 2024 CT a/p showed a nl bladder, not distended. Unremarkable prostate. Sees GI for the pancreas finding. He has had recent back pain and abdominal pain.   He did not take or fill the muscle relaxer. He prefers no meds.   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   PMH: Past Medical History:  Diagnosis Date   DVT (deep venous thrombosis) ([place])     Surgical History: Past Surgical History:  Procedure Laterality Date   WISDOM TOOTH EXTRACTION      Home Medications:  Allergies as of 04/13/2022       Reactions   Sulfa Antibiotics Rash        Medication List        Accurate as of April 13, 2022  2:12 PM. If you have any questions, ask your nurse or doctor.          ibuprofen 800 MG tablet Commonly known as: ADVIL Take 1 tablet (800 mg total) by mouth 3 (three) times daily. Take with food   methocarbamol 500 MG tablet Commonly known as: ROBAXIN Take 1 tablet (500 mg total) by mouth 3 (three) times daily.   pramoxine 1 % foam Commonly known as: Proctofoam Place 1 Application rectally 3 (three) times daily as needed for anal itching, anal irritation or hemorrhoids.        Allergies:  Allergies  Allergen Reactions   Sulfa Antibiotics Rash    Family History: No family history on file.  Social History:  reports that he has quit smoking. His smoking use included cigarettes. He has never used smokeless tobacco. He reports that he does not currently use alcohol. He reports that he does  not use drugs.   Physical Exam: There were no vitals taken for this visit.  Constitutional:  Alert and oriented, No acute distress. HEENT: [place] AT, moist mucus membranes.  Trachea midline, no masses. Cardiovascular: No clubbing, cyanosis, or edema. Respiratory: Normal respiratory effort, no increased work of breathing. GI: Abdomen is soft, nontender, nondistended, no abdominal masses GU: No CVA tenderness Skin: No rashes, bruises or suspicious lesions. Neurologic: Grossly intact, no focal deficits, moving all 4 extremities. Psychiatric: Normal mood and affect. GU: Penis circumcised, normal foreskin, testicles descended bilaterally and palpably normal, bilateral epididymis palpably normal, scrotum normal DRE: Prostate 20 g, smooth without hard area or nodule, no pelvic floor tenderness.    Laboratory Data: Lab Results  Component Value Date   WBC 6.4 03/29/2022   HGB 17.2 (H) 03/29/2022   HCT 52.5 (H) 03/29/2022   MCV 91.3 03/29/2022   PLT 295 03/29/2022    Lab Results  Component Value Date   CREATININE 1.11 03/29/2022    No results found for: "PSA"  No results found for: "TESTOSTERONE"  No results found for: "HGBA1C"  Urinalysis    Component Value Date/Time   COLORURINE YELLOW 03/29/2022 [place] 03/29/2022 0756   LABSPEC 1.017 03/29/2022 [place]  5.0 03/29/2022 0756   GLUCOSEU NEGATIVE 03/29/2022 0756   HGBUR NEGATIVE 03/29/2022 0756   BILIRUBINUR NEGATIVE 03/29/2022 0756   KETONESUR NEGATIVE 03/29/2022 0756   PROTEINUR NEGATIVE 03/29/2022 0756   NITRITE NEGATIVE 03/29/2022 0756   LEUKOCYTESUR NEGATIVE 03/29/2022 0756    No results found for: "LABMICR", "WBCUA", "RBCUA", "LABEPIT", "MUCUS", "BACTERIA"  Pertinent Imaging:    Assessment & Plan:    1. Urinary hesitancy and frequency - disc trial of alpha blocker or OAB meds. He needs peace of mind and we discussed cystoscopy and he will proceed.  He prefers no meds.  - BLADDER SCAN AMB  NON-IMAGING - Urinalysis, Routine w reflex microscopic   No follow-ups on file.  Festus Aloe, MD  [facility]  [address] [place], [place] [address] [phone]

## 2022-04-13 NOTE — Progress Notes (Unsigned)
post void residual = 0 ml

## 2022-04-20 NOTE — Progress Notes (Deleted)
GI Office Note    Referring Provider: No ref. provider found Primary Care Physician:  Patient, No Pcp Per  Primary Gastroenterologist: ***  Chief Complaint   No chief complaint on file.   History of Present Illness   James Rhodes is a 37 y.o. male presenting today at the request of No ref. provider found for ***abnormal CT results.  Patient with multiple ED visits to [facility] since the end of November 2023: Visit in November 2023 for rectal bleeding. Noted to have external hemorrhoids, advised outpatient GI evaluation and discharged with anusol. Patient concerned about prostate cancer. Labs normal other than mildly elevated Hgb at 17.1.   ED visit to [facility] and [facility] in early/mid December for difficulty with urination and low back pain.   ED visit [facility] 03/09/22 for hemorrhoids and discomfort with Bms. Also with urinary urgency and discomfort. Recent normal UA and labs. Hemorrhoid not thrombosed. Cream prescribed.   ED visit 03/16/22 for dysuria and urinary retention. Pt advised to follow up with urology.   ED visit 03/29/22 for dysuria and urinary frequency and lower abdominal pain. Denied N/V/D. Low back pain. Former smoker. No DVT. Hg 17.2, glucose elevated at 123. CT performed as outlined below. Labs wnl. Advised follow up with GI for further evaluation of CT findings.   CT A/P with contrast 03/29/22: - sub-centimeter lesion in the liver too small to characterize. No biliary dilation.  -pancreas with rounded mass like contour abnormality at the head. No definite altered perfusion. -no pancreatic ductal dilation. (Follow up MRI/MRCP recommended for liver and pancreas lesions) -colonic diverticulosis without diverticulitis.   Today:   Current Outpatient Medications  Medication Sig Dispense Refill   ibuprofen (ADVIL) 800 MG tablet Take 1 tablet (800 mg total) by mouth 3 (three) times daily. Take with food 21 tablet 0   methocarbamol (ROBAXIN) 500 MG tablet Take 1 tablet (500 mg total) by  mouth 3 (three) times daily. 21 tablet 0   pramoxine (PROCTOFOAM) 1 % foam Place 1 Application rectally 3 (three) times daily as needed for anal itching, anal irritation or hemorrhoids. 15 g 0   No current facility-administered medications for this visit.    Past Medical History:  Diagnosis Date   DVT (deep venous thrombosis) (HCC)     Past Surgical History:  Procedure Laterality Date   WISDOM TOOTH EXTRACTION      No family history on file.  Allergies as of 04/21/2022 - Review Complete 04/13/2022  Allergen Reaction Noted   Sulfa antibiotics Rash 01/12/2020    Social History   Socioeconomic History   Marital status: Divorced    Spouse name: Not on file   Number of children: Not on file   Years of education: Not on file   Highest education level: Not on file  Occupational History   Not on file  Tobacco Use   Smoking status: Former    Types: Cigarettes   Smokeless tobacco: Never  Vaping Use   Vaping Use: Never used  Substance and Sexual Activity   Alcohol use: Not Currently   Drug use: Never   Sexual activity: Not on file  Other Topics Concern   Not on file  Social History Narrative   Not on file   Social Determinants of Health   Financial Resource Strain: Not on file  Food Insecurity: Not on file  Transportation Needs: Not on file  Physical Activity: Not on file  Stress: Not on file  Social Connections: Not on file  Intimate Partner Violence: Not on file     Review of Systems   Gen: Denies any fever, chills, fatigue, weight loss, lack of appetite.  CV: Denies chest pain, heart palpitations, peripheral edema, syncope.  Resp: Denies shortness of breath at rest or with exertion. Denies wheezing or cough.  GI: see HPI GU : Denies urinary burning, urinary frequency, urinary hesitancy MS: Denies joint pain, muscle weakness, cramps, or limitation of movement.  Derm: Denies rash, itching, dry skin Psych: Denies depression, anxiety, memory loss, and  confusion Heme: Denies bruising, bleeding, and enlarged lymph nodes.   Physical Exam   There were no vitals taken for this visit.  General:   Alert and oriented. Pleasant and cooperative. Well-nourished and well-developed.  Head:  Normocephalic and atraumatic. Eyes:  Without icterus, sclera clear and conjunctiva pink.  Ears:  Normal auditory acuity. Mouth:  No deformity or lesions, oral mucosa pink.  Lungs:  Clear to auscultation bilaterally. No wheezes, rales, or rhonchi. No distress.  Heart:  S1, S2 present without murmurs appreciated.  Abdomen:  +BS, soft, non-tender and non-distended. No HSM noted. No guarding or rebound. No masses appreciated.  Rectal:  Deferred  Msk:  Symmetrical without gross deformities. Normal posture. Extremities:  Without edema. Neurologic:  Alert and  oriented x4;  grossly normal neurologically. Skin:  Intact without significant lesions or rashes. Psych:  Alert and cooperative. Normal mood and affect.   Assessment   James Rhodes is a 37 y.o. male with a history of anxiety, migraines, and dysuria/chronic urological issues presenting today to discuss further evaluation of pancreatic and liver lesions.    Hepatic and pancreatic lesions on CT:    PLAN   *** MRI/MRCP with IV contrast    Venetia Night, MSN, FNP-BC, AGACNP-BC [facility]

## 2022-04-21 ENCOUNTER — Encounter: Payer: Self-pay | Admitting: Gastroenterology

## 2022-04-21 ENCOUNTER — Inpatient Hospital Stay: Payer: Self-pay | Admitting: Gastroenterology

## 2022-04-22 ENCOUNTER — Encounter: Payer: Self-pay | Admitting: Gastroenterology

## 2022-05-13 ENCOUNTER — Ambulatory Visit: Payer: Self-pay | Admitting: Urology

## 2022-05-18 ENCOUNTER — Ambulatory Visit (INDEPENDENT_AMBULATORY_CARE_PROVIDER_SITE_OTHER): Payer: BC Managed Care – PPO | Admitting: Urology

## 2022-05-18 VITALS — BP 147/91 | HR 91 | Ht 67.0 in | Wt 230.0 lb

## 2022-05-18 DIAGNOSIS — R3911 Hesitancy of micturition: Secondary | ICD-10-CM

## 2022-05-18 MED ORDER — CIPROFLOXACIN HCL 500 MG PO TABS
500.0000 mg | ORAL_TABLET | Freq: Once | ORAL | Status: AC
  Administered 2022-05-18: 500 mg via ORAL

## 2022-05-18 NOTE — Progress Notes (Signed)
  [name]  05/18/22  CC: No chief complaint on file.   HPI:  1) urinary hesitancy - He has some hesitancy. He has a good stream at time and sometimes slower. He doesn't feel empty. Has some PV dribble. Sometimes a dull SP ache. Going on for a few years. No hip or back issues. No GU surgery. No NG risk. AUASS = 20. No constipation.    UA negative, STI negative, Cr 1.1. Jan 2024 CT a/p showed a nl bladder, not distended. Unremarkable prostate. Sees GI for the pancreas finding. He has had recent back pain and abdominal pain.    He did not take or fill the muscle relaxer. He has a "shy bladder". He prefers no meds. Here today for cystoscopy. No dysuria or gross hematuria. UA clear. Cysto today - Mar 2024 - benign. No BPH or high BN. Nl bladder.    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     Blood pressure (!) 147/91, pulse 91, height '5\' 7"'$  (1.702 m), weight 230 lb (104.3 kg). NED. A&Ox3.   No respiratory distress   Abd soft, NT, ND Normal phallus with bilateral descended testicles  Cystoscopy Procedure Note  Patient identification was confirmed, informed consent was obtained, and patient was prepped using Betadine solution.  Lidocaine jelly was administered per urethral meatus.     Pre-Procedure: - Inspection reveals a normal caliber ureteral meatus.  Procedure: The flexible cystoscope was introduced without difficulty - No urethral strictures/lesions are present. - non obstructing prostate - normal bladder neck - Bilateral ureteral orifices identified - Bladder mucosa  reveals no ulcers, tumors, or lesions - No bladder stones - No trabeculation  Retroflexion shows normal bladder/bladder neck.    Post-Procedure: - Patient tolerated the procedure well  Assessment/ Plan:  Hesitancy - drink water. Take your time in the bathroom to empty. No anatomical obstruction.   No follow-ups on file.  Festus Aloe, MD

## 2022-05-19 LAB — URINALYSIS, ROUTINE W REFLEX MICROSCOPIC
Bilirubin, UA: NEGATIVE
Glucose, UA: NEGATIVE
Ketones, UA: NEGATIVE
Leukocytes,UA: NEGATIVE
Nitrite, UA: NEGATIVE
Protein,UA: NEGATIVE
RBC, UA: NEGATIVE
Specific Gravity, UA: 1.02 (ref 1.005–1.030)
Urobilinogen, Ur: 0.2 mg/dL (ref 0.2–1.0)
pH, UA: 6.5 (ref 5.0–7.5)

## 2022-11-30 ENCOUNTER — Ambulatory Visit: Payer: BC Managed Care – PPO | Admitting: Urology
